# Patient Record
Sex: Male | Born: 1940 | Race: White | Hispanic: No | Marital: Married | Smoking: Never smoker
Health system: Southern US, Community
[De-identification: ages and names within clinical notes are randomized; demographics above are authoritative.]

## PROBLEM LIST (undated history)

## (undated) DIAGNOSIS — M1990 Unspecified osteoarthritis, unspecified site: Secondary | ICD-10-CM

## (undated) DIAGNOSIS — I2510 Atherosclerotic heart disease of native coronary artery without angina pectoris: Secondary | ICD-10-CM

## (undated) DIAGNOSIS — I340 Nonrheumatic mitral (valve) insufficiency: Secondary | ICD-10-CM

## (undated) DIAGNOSIS — I10 Essential (primary) hypertension: Secondary | ICD-10-CM

## (undated) DIAGNOSIS — Z8679 Personal history of other diseases of the circulatory system: Secondary | ICD-10-CM

## (undated) DIAGNOSIS — G20A1 Parkinson's disease without dyskinesia, without mention of fluctuations: Secondary | ICD-10-CM

## (undated) DIAGNOSIS — G459 Transient cerebral ischemic attack, unspecified: Secondary | ICD-10-CM

## (undated) DIAGNOSIS — N400 Enlarged prostate without lower urinary tract symptoms: Secondary | ICD-10-CM

## (undated) DIAGNOSIS — K219 Gastro-esophageal reflux disease without esophagitis: Secondary | ICD-10-CM

## (undated) DIAGNOSIS — C61 Malignant neoplasm of prostate: Secondary | ICD-10-CM

## (undated) DIAGNOSIS — Z8546 Personal history of malignant neoplasm of prostate: Secondary | ICD-10-CM

## (undated) HISTORY — DX: Parkinson's disease without dyskinesia, without mention of fluctuations: G20.A1

## (undated) HISTORY — PX: ACHILLES TENDON REPAIR: SUR1153

## (undated) HISTORY — DX: Personal history of other diseases of the circulatory system: Z86.79

## (undated) HISTORY — DX: Unspecified osteoarthritis, unspecified site: M19.90

## (undated) HISTORY — DX: Personal history of malignant neoplasm of prostate: Z85.46

## (undated) HISTORY — DX: Transient cerebral ischemic attack, unspecified: G45.9

## (undated) HISTORY — PX: HERNIA REPAIR: SHX51

## (undated) HISTORY — DX: Atherosclerotic heart disease of native coronary artery without angina pectoris: I25.10

## (undated) HISTORY — DX: Benign prostatic hyperplasia without lower urinary tract symptoms: N40.0

## (undated) HISTORY — DX: Nonrheumatic mitral (valve) insufficiency: I34.0

## (undated) HISTORY — PX: PROSTATE BIOPSY: SHX241

## (undated) HISTORY — PX: ARTHROSCOPY KNEE W/ DRILLING: SUR92

---
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
## 2011-08-27 ENCOUNTER — Other Ambulatory Visit: Payer: Self-pay | Admitting: Orthopaedic Surgery

## 2011-08-27 DIAGNOSIS — M545 Low back pain: Secondary | ICD-10-CM

## 2011-09-01 ENCOUNTER — Ambulatory Visit
Admission: RE | Admit: 2011-09-01 | Discharge: 2011-09-01 | Disposition: A | Discharge status: Home / Self Care | Payer: Medicare Other | Source: Ambulatory Visit | Attending: Orthopaedic Surgery | Admitting: Orthopaedic Surgery

## 2011-09-01 DIAGNOSIS — M545 Low back pain: Secondary | ICD-10-CM

## 2011-09-02 ENCOUNTER — Other Ambulatory Visit: Payer: Self-pay

## 2015-01-21 DIAGNOSIS — T485X5A Adverse effect of other anti-common-cold drugs, initial encounter: Secondary | ICD-10-CM | POA: Insufficient documentation

## 2015-01-21 DIAGNOSIS — J3489 Other specified disorders of nose and nasal sinuses: Secondary | ICD-10-CM

## 2015-01-21 DIAGNOSIS — J310 Chronic rhinitis: Secondary | ICD-10-CM | POA: Insufficient documentation

## 2015-01-21 DIAGNOSIS — J343 Hypertrophy of nasal turbinates: Secondary | ICD-10-CM | POA: Insufficient documentation

## 2015-01-21 HISTORY — DX: Other specified disorders of nose and nasal sinuses: J34.89

## 2015-05-08 DIAGNOSIS — L570 Actinic keratosis: Secondary | ICD-10-CM | POA: Diagnosis not present

## 2015-05-08 DIAGNOSIS — L820 Inflamed seborrheic keratosis: Secondary | ICD-10-CM | POA: Diagnosis not present

## 2015-05-08 DIAGNOSIS — L578 Other skin changes due to chronic exposure to nonionizing radiation: Secondary | ICD-10-CM | POA: Diagnosis not present

## 2015-05-08 DIAGNOSIS — L821 Other seborrheic keratosis: Secondary | ICD-10-CM | POA: Diagnosis not present

## 2015-06-10 DIAGNOSIS — J9811 Atelectasis: Secondary | ICD-10-CM | POA: Diagnosis not present

## 2015-06-10 DIAGNOSIS — R0789 Other chest pain: Secondary | ICD-10-CM | POA: Diagnosis not present

## 2015-06-10 DIAGNOSIS — S299XXA Unspecified injury of thorax, initial encounter: Secondary | ICD-10-CM | POA: Diagnosis not present

## 2015-06-10 DIAGNOSIS — Y9389 Activity, other specified: Secondary | ICD-10-CM | POA: Diagnosis not present

## 2015-06-10 DIAGNOSIS — S20211A Contusion of right front wall of thorax, initial encounter: Secondary | ICD-10-CM | POA: Diagnosis not present

## 2015-06-10 DIAGNOSIS — W1789XA Other fall from one level to another, initial encounter: Secondary | ICD-10-CM | POA: Diagnosis not present

## 2015-06-10 DIAGNOSIS — M25551 Pain in right hip: Secondary | ICD-10-CM | POA: Diagnosis not present

## 2015-06-10 DIAGNOSIS — R0781 Pleurodynia: Secondary | ICD-10-CM | POA: Diagnosis not present

## 2015-06-11 DIAGNOSIS — S299XXA Unspecified injury of thorax, initial encounter: Secondary | ICD-10-CM | POA: Diagnosis not present

## 2015-06-11 DIAGNOSIS — M25551 Pain in right hip: Secondary | ICD-10-CM | POA: Diagnosis not present

## 2015-07-17 DIAGNOSIS — D510 Vitamin B12 deficiency anemia due to intrinsic factor deficiency: Secondary | ICD-10-CM | POA: Diagnosis not present

## 2015-08-22 DIAGNOSIS — D510 Vitamin B12 deficiency anemia due to intrinsic factor deficiency: Secondary | ICD-10-CM | POA: Diagnosis not present

## 2015-08-26 DIAGNOSIS — M25512 Pain in left shoulder: Secondary | ICD-10-CM | POA: Diagnosis not present

## 2015-08-26 DIAGNOSIS — D510 Vitamin B12 deficiency anemia due to intrinsic factor deficiency: Secondary | ICD-10-CM | POA: Diagnosis not present

## 2015-08-26 DIAGNOSIS — Z79899 Other long term (current) drug therapy: Secondary | ICD-10-CM | POA: Diagnosis not present

## 2015-08-26 DIAGNOSIS — Z0000 Encounter for general adult medical examination without abnormal findings: Secondary | ICD-10-CM | POA: Diagnosis not present

## 2015-08-26 DIAGNOSIS — E785 Hyperlipidemia, unspecified: Secondary | ICD-10-CM | POA: Diagnosis not present

## 2015-08-26 DIAGNOSIS — Z1389 Encounter for screening for other disorder: Secondary | ICD-10-CM | POA: Diagnosis not present

## 2015-08-26 DIAGNOSIS — Z125 Encounter for screening for malignant neoplasm of prostate: Secondary | ICD-10-CM | POA: Diagnosis not present

## 2015-08-26 DIAGNOSIS — G479 Sleep disorder, unspecified: Secondary | ICD-10-CM | POA: Diagnosis not present

## 2015-08-26 DIAGNOSIS — R5383 Other fatigue: Secondary | ICD-10-CM | POA: Diagnosis not present

## 2015-09-19 DIAGNOSIS — E538 Deficiency of other specified B group vitamins: Secondary | ICD-10-CM | POA: Diagnosis not present

## 2015-11-06 DIAGNOSIS — E538 Deficiency of other specified B group vitamins: Secondary | ICD-10-CM | POA: Diagnosis not present

## 2015-11-07 DIAGNOSIS — N401 Enlarged prostate with lower urinary tract symptoms: Secondary | ICD-10-CM | POA: Diagnosis not present

## 2015-11-07 DIAGNOSIS — R3915 Urgency of urination: Secondary | ICD-10-CM | POA: Diagnosis not present

## 2015-11-07 DIAGNOSIS — R972 Elevated prostate specific antigen [PSA]: Secondary | ICD-10-CM | POA: Diagnosis not present

## 2015-11-07 DIAGNOSIS — R3911 Hesitancy of micturition: Secondary | ICD-10-CM | POA: Diagnosis not present

## 2015-11-07 DIAGNOSIS — R351 Nocturia: Secondary | ICD-10-CM | POA: Diagnosis not present

## 2015-11-14 DIAGNOSIS — L578 Other skin changes due to chronic exposure to nonionizing radiation: Secondary | ICD-10-CM | POA: Diagnosis not present

## 2015-11-14 DIAGNOSIS — L570 Actinic keratosis: Secondary | ICD-10-CM | POA: Diagnosis not present

## 2015-12-02 DIAGNOSIS — E538 Deficiency of other specified B group vitamins: Secondary | ICD-10-CM | POA: Diagnosis not present

## 2016-01-26 DIAGNOSIS — E538 Deficiency of other specified B group vitamins: Secondary | ICD-10-CM | POA: Diagnosis not present

## 2016-03-03 DIAGNOSIS — E538 Deficiency of other specified B group vitamins: Secondary | ICD-10-CM | POA: Diagnosis not present

## 2016-03-22 ENCOUNTER — Ambulatory Visit (INDEPENDENT_AMBULATORY_CARE_PROVIDER_SITE_OTHER): Payer: PPO

## 2016-03-22 ENCOUNTER — Encounter (INDEPENDENT_AMBULATORY_CARE_PROVIDER_SITE_OTHER): Payer: Self-pay | Admitting: Orthopaedic Surgery

## 2016-03-22 ENCOUNTER — Ambulatory Visit (INDEPENDENT_AMBULATORY_CARE_PROVIDER_SITE_OTHER): Payer: PPO | Admitting: Orthopaedic Surgery

## 2016-03-22 VITALS — BP 130/78 | HR 68 | Ht 69.0 in | Wt 190.0 lb

## 2016-03-22 DIAGNOSIS — M25512 Pain in left shoulder: Secondary | ICD-10-CM | POA: Diagnosis not present

## 2016-03-22 DIAGNOSIS — M545 Low back pain: Secondary | ICD-10-CM

## 2016-03-22 DIAGNOSIS — G8929 Other chronic pain: Secondary | ICD-10-CM

## 2016-03-22 DIAGNOSIS — G894 Chronic pain syndrome: Secondary | ICD-10-CM

## 2016-03-22 MED ORDER — METHYLPREDNISOLONE ACETATE 40 MG/ML IJ SUSP
80.0000 mg | INTRAMUSCULAR | Status: AC | PRN
  Administered 2016-03-22: 80 mg

## 2016-03-22 MED ORDER — LIDOCAINE HCL 1 % IJ SOLN
2.0000 mL | INTRAMUSCULAR | Status: AC | PRN
  Administered 2016-03-22: 2 mL

## 2016-03-22 MED ORDER — BUPIVACAINE HCL 0.5 % IJ SOLN
2.0000 mL | INTRAMUSCULAR | Status: AC | PRN
  Administered 2016-03-22: 2 mL via INTRA_ARTICULAR

## 2016-03-22 NOTE — Progress Notes (Signed)
Office Visit Note   Patient: Wesley Jackson           Date of Birth: Oct 12, 1940           MRN: [id number] Visit Date: 03/22/2016              Requested by: No referring provider defined for this encounter. PCP: Angelina Sheriff., MD   Assessment & Plan: Visit Diagnoses: No diagnosis found.  Plan: Follow up in 3-4 weeks for left shoulder pain if no response to cortisone. We will see after MRI scan of lumbar spine.  Follow-Up Instructions: No Follow-up on file.   Orders:  No orders of the defined types were placed in this encounter.  No orders of the defined types were placed in this encounter.     Procedures: Large Joint Inj Date/Time: 03/22/2016 11:19 AM Performed by: Garald Balding Authorized by: Garald Balding   Consent Given by:  Patient Timeout: prior to procedure the correct patient, procedure, and site was verified   Indications:  Pain Location:  Shoulder Site:  L subacromial bursa Prep: patient was prepped and draped in usual sterile fashion   Needle Size:  25 G Needle Length:  1.5 inches Approach:  Lateral Ultrasound Guidance: No   Fluoroscopic Guidance: No   Arthrogram: No   Medications:  80 mg methylPREDNISolone acetate 40 MG/ML; 2 mL lidocaine 1 %; 2 mL bupivacaine 0.5 % Aspiration Attempted: No   Patient tolerance:  Patient tolerated the procedure well with no immediate complications     Clinical Data: No additional findings.   Subjective: No chief complaint on file.   Pt fell 06/10/2015, repairing a wall, fell about 5 feet down to ground. Had some pain in left should and ribs  Pt has had cortisone injections in the past and would like one today   Mr. Dower is been experiencing left shoulder pain since his fall in February 2017. Pain seems to have exacerbated to the point where he is having trouble sleeping. The pain is located along the anterior lateral subacromial region. There is no ecchymosis or erythema at the time of the  injury.  He also is been experiencing some right sided low back pain since the injury. He's had some difficulty when he swings a golf club. Denies any trouble with cough or sneeze. He's not had any pain referred to either lower extremity. He does have a prior history of low back pain with the injections. Review of Systems   Objective: Vital Signs: There were no vitals taken for this visit.  Physical Exam  Ortho Exam left shoulder exam reveals positive impingement and positive empty can testing. He does have a painful overhead arc of motion but is able to place his arm fully over his head and is able to touch the middle of his back. Biceps was intact. There was no crepitation. He had some mild discomfort at the before meals joint.  Straight leg raise is negative bilaterally pain is range of motion of both hips. There was no flank discomfort. It was some pain in the right paralumbar region without mass formation. No pain over any of the lower right ribs  Specialty Comments:  No specialty comments available.  Imaging: No results found.   PMFS History: There are no active problems to display for this patient.  No past medical history on file.  No family history on file.  No past surgical history on file. Social History   Occupational History  .  Not on file.   Social History Main Topics  . Smoking status: Not on file  . Smokeless tobacco: Not on file  . Alcohol use Not on file  . Drug use: Unknown  . Sexual activity: Not on file

## 2016-03-31 ENCOUNTER — Telehealth (INDEPENDENT_AMBULATORY_CARE_PROVIDER_SITE_OTHER): Payer: Self-pay | Admitting: Orthopaedic Surgery

## 2016-03-31 NOTE — Telephone Encounter (Signed)
Patient's wife called and says they have not heard anything about scheduling an MRI. Has this been ordered? Please call patient.

## 2016-04-01 NOTE — Telephone Encounter (Signed)
Spoke with wife that a referral was made and sent for scheduling with imaging

## 2016-04-05 ENCOUNTER — Ambulatory Visit (INDEPENDENT_AMBULATORY_CARE_PROVIDER_SITE_OTHER): Payer: PPO | Admitting: Orthopaedic Surgery

## 2016-04-08 ENCOUNTER — Ambulatory Visit
Admission: RE | Admit: 2016-04-08 | Discharge: 2016-04-08 | Disposition: A | Discharge status: Home / Self Care | Payer: PPO | Source: Ambulatory Visit | Attending: Orthopaedic Surgery | Admitting: Orthopaedic Surgery

## 2016-04-08 DIAGNOSIS — M5126 Other intervertebral disc displacement, lumbar region: Secondary | ICD-10-CM | POA: Diagnosis not present

## 2016-04-08 DIAGNOSIS — G8929 Other chronic pain: Secondary | ICD-10-CM

## 2016-04-08 DIAGNOSIS — M545 Low back pain, unspecified: Secondary | ICD-10-CM

## 2016-04-12 ENCOUNTER — Other Ambulatory Visit (INDEPENDENT_AMBULATORY_CARE_PROVIDER_SITE_OTHER): Payer: Self-pay

## 2016-04-12 ENCOUNTER — Encounter (INDEPENDENT_AMBULATORY_CARE_PROVIDER_SITE_OTHER): Payer: Self-pay | Admitting: Orthopaedic Surgery

## 2016-04-12 ENCOUNTER — Ambulatory Visit (INDEPENDENT_AMBULATORY_CARE_PROVIDER_SITE_OTHER): Payer: PPO

## 2016-04-12 ENCOUNTER — Ambulatory Visit (INDEPENDENT_AMBULATORY_CARE_PROVIDER_SITE_OTHER): Payer: PPO | Admitting: Orthopaedic Surgery

## 2016-04-12 VITALS — Ht 69.0 in | Wt 190.0 lb

## 2016-04-12 DIAGNOSIS — R0781 Pleurodynia: Secondary | ICD-10-CM | POA: Diagnosis not present

## 2016-04-12 NOTE — Progress Notes (Signed)
   Office Visit Note   Patient: Wesley Jackson           Date of Birth: [birth year]           MRN: [id number] Visit Date: 04/12/2016              Requested by: Angelina Sheriff, MD [place],  [id number] PCP: Angelina Sheriff., MD   Assessment & Plan: Visit Diagnoses: low back and rib pain  Plan: I think the problem is related to his rib cage. I'm not so sure that the pain is referred from his lumbar spine. Based on his regular blood films I think he's had a costochondritis involving the eighth ninth and 10th ribs Mr. Troup is still a little concerned about his pain I think just to allay his fears were need to obtain an MRI scan of that area. Pain does come and go it is not constant and it really hasn't changed since his accident.  Follow-Up Instructions: No Follow-up on file.   Orders:  No orders of the defined types were placed in this encounter.  No orders of the defined types were placed in this encounter.     Procedures: No procedures performed   Clinical Data: No additional findings.   Subjective: No chief complaint on file.   Pt still having middle to lower back pain.  Mr. Board had an MRI scan of his lumbar spine that was essentially unchanged from the scan that was performed in 2013. 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 He had an injury as mentioned in February 2017 and has had some trouble with the right side of his back since that time. He actually had some trouble prior to that injury he apparently had an extensive evaluation after that fall but continues to have some pain in the right paralumbar region that actually may correspond to to a repair. He isnot having any bowel or bladder dysfunction and has not had any abdominal complaints. His had some lab work performed through his primary care physician at [facility] in [place]. He notes  that it was "okay.  Review of Systems   Objective: Vital Signs: There were no vitals taken for this visit.  Physical Exam  Ortho Exam straight leg raise is negative bilaterally reflexes appear to be symmetrical. Mr. Latimer does have some pain that corresponds to one of the lower ribs and the right paralumbar region not having any shortness of breath or chest pain.   No specialty comments available.  Imaging: No results found.   PMFS History: There are no active problems to display for this patient.  No past medical history on file.  No family history on file.  No past surgical history on file. Social History   Occupational History  . Not on file.   Social History Main Topics  . Smoking status: Never Smoker  . Smokeless tobacco: Never Used  . Alcohol use 0.6 oz/week    1 Cans of beer per week  . Drug use: No  . Sexual activity: Not on file

## 2016-04-13 NOTE — Addendum Note (Signed)
Addended by: Shona Needles on: 04/13/2016 11:32 AM   Modules accepted: Orders

## 2016-04-30 ENCOUNTER — Other Ambulatory Visit: Payer: PPO

## 2016-05-03 ENCOUNTER — Ambulatory Visit (INDEPENDENT_AMBULATORY_CARE_PROVIDER_SITE_OTHER): Payer: PPO | Admitting: Orthopaedic Surgery

## 2016-05-14 ENCOUNTER — Telehealth (INDEPENDENT_AMBULATORY_CARE_PROVIDER_SITE_OTHER): Payer: Self-pay | Admitting: Orthopaedic Surgery

## 2016-05-14 NOTE — Telephone Encounter (Signed)
Patient called on status of MRI for upper back and rib cage. Please call patient back at [phone]

## 2016-05-14 NOTE — Telephone Encounter (Signed)
Please call.

## 2016-05-18 NOTE — Telephone Encounter (Signed)
Please call with results

## 2016-05-18 NOTE — Telephone Encounter (Signed)
Please have someone check on why rthis has not been done yet

## 2016-05-19 NOTE — Telephone Encounter (Signed)
Thank you :)

## 2016-05-19 NOTE — Telephone Encounter (Signed)
Patient called on status of MRI, I explained [facility] had tried calling. Patient is going to call them and schedule MRI; will call us once scheduled.

## 2016-05-19 NOTE — Telephone Encounter (Signed)
[facility] contacted pt on 04/14/16 & 04/22/16 and both times left message on machine to return call, they are waiting on pt to contact them. Everything on our end has been taken care of.

## 2016-05-19 NOTE — Telephone Encounter (Signed)
Could you please check and see why this isn't done? Thank you!

## 2016-05-26 ENCOUNTER — Telehealth (INDEPENDENT_AMBULATORY_CARE_PROVIDER_SITE_OTHER): Payer: Self-pay | Admitting: Orthopaedic Surgery

## 2016-05-26 NOTE — Telephone Encounter (Signed)
Patient's wife called about status of MRI. Please advise.

## 2016-05-27 NOTE — Telephone Encounter (Signed)
I called [facility], patient has been left messages x 2 to call to schedule MRI appt, I called patient and advised him to call [facility] to schedule MRI appt.

## 2016-05-27 NOTE — Telephone Encounter (Signed)
Please advise 

## 2016-05-27 NOTE — Telephone Encounter (Signed)
Please resolve this re the MRI-going on for nearly 6 weeks

## 2016-06-04 ENCOUNTER — Ambulatory Visit
Admission: RE | Admit: 2016-06-04 | Discharge: 2016-06-04 | Disposition: A | Discharge status: Home / Self Care | Payer: PPO | Source: Ambulatory Visit | Attending: Orthopaedic Surgery | Admitting: Orthopaedic Surgery

## 2016-06-04 DIAGNOSIS — R0781 Pleurodynia: Secondary | ICD-10-CM

## 2016-06-04 DIAGNOSIS — M62838 Other muscle spasm: Secondary | ICD-10-CM | POA: Diagnosis not present

## 2016-06-14 ENCOUNTER — Ambulatory Visit (INDEPENDENT_AMBULATORY_CARE_PROVIDER_SITE_OTHER): Payer: PPO | Admitting: Orthopaedic Surgery

## 2016-06-23 DIAGNOSIS — H524 Presbyopia: Secondary | ICD-10-CM | POA: Diagnosis not present

## 2016-06-23 DIAGNOSIS — H2513 Age-related nuclear cataract, bilateral: Secondary | ICD-10-CM | POA: Diagnosis not present

## 2016-06-28 DIAGNOSIS — H52202 Unspecified astigmatism, left eye: Secondary | ICD-10-CM | POA: Diagnosis not present

## 2016-06-28 DIAGNOSIS — Z01818 Encounter for other preprocedural examination: Secondary | ICD-10-CM | POA: Diagnosis not present

## 2016-06-28 DIAGNOSIS — H2512 Age-related nuclear cataract, left eye: Secondary | ICD-10-CM | POA: Diagnosis not present

## 2016-06-28 DIAGNOSIS — H25812 Combined forms of age-related cataract, left eye: Secondary | ICD-10-CM | POA: Diagnosis not present

## 2016-06-28 DIAGNOSIS — H2513 Age-related nuclear cataract, bilateral: Secondary | ICD-10-CM | POA: Diagnosis not present

## 2016-07-14 DIAGNOSIS — H2511 Age-related nuclear cataract, right eye: Secondary | ICD-10-CM | POA: Diagnosis not present

## 2016-07-14 DIAGNOSIS — H52201 Unspecified astigmatism, right eye: Secondary | ICD-10-CM | POA: Diagnosis not present

## 2016-07-14 DIAGNOSIS — H2513 Age-related nuclear cataract, bilateral: Secondary | ICD-10-CM | POA: Diagnosis not present

## 2016-07-14 DIAGNOSIS — H25811 Combined forms of age-related cataract, right eye: Secondary | ICD-10-CM | POA: Diagnosis not present

## 2016-07-20 DIAGNOSIS — E538 Deficiency of other specified B group vitamins: Secondary | ICD-10-CM | POA: Diagnosis not present

## 2016-08-05 DIAGNOSIS — Z6829 Body mass index (BMI) 29.0-29.9, adult: Secondary | ICD-10-CM | POA: Diagnosis not present

## 2016-08-05 DIAGNOSIS — J329 Chronic sinusitis, unspecified: Secondary | ICD-10-CM | POA: Diagnosis not present

## 2016-08-05 DIAGNOSIS — J40 Bronchitis, not specified as acute or chronic: Secondary | ICD-10-CM | POA: Diagnosis not present

## 2016-08-19 DIAGNOSIS — E538 Deficiency of other specified B group vitamins: Secondary | ICD-10-CM | POA: Diagnosis not present

## 2016-08-19 DIAGNOSIS — Z6829 Body mass index (BMI) 29.0-29.9, adult: Secondary | ICD-10-CM | POA: Diagnosis not present

## 2016-08-19 DIAGNOSIS — M545 Low back pain: Secondary | ICD-10-CM | POA: Diagnosis not present

## 2016-08-23 ENCOUNTER — Telehealth (INDEPENDENT_AMBULATORY_CARE_PROVIDER_SITE_OTHER): Payer: Self-pay | Admitting: Physical Medicine and Rehabilitation

## 2016-08-23 NOTE — Telephone Encounter (Signed)
Ok to [employer]	[place]

## 2016-08-24 NOTE — Telephone Encounter (Signed)
Patient is scheduled for an OV 08/26/16 at [facility].

## 2016-08-26 ENCOUNTER — Encounter (INDEPENDENT_AMBULATORY_CARE_PROVIDER_SITE_OTHER): Payer: Self-pay

## 2016-08-26 ENCOUNTER — Telehealth (INDEPENDENT_AMBULATORY_CARE_PROVIDER_SITE_OTHER): Payer: Self-pay | Admitting: Physical Medicine and Rehabilitation

## 2016-08-26 ENCOUNTER — Encounter (INDEPENDENT_AMBULATORY_CARE_PROVIDER_SITE_OTHER): Payer: Self-pay | Admitting: Physical Medicine and Rehabilitation

## 2016-08-26 ENCOUNTER — Ambulatory Visit (INDEPENDENT_AMBULATORY_CARE_PROVIDER_SITE_OTHER): Payer: PPO | Admitting: Physical Medicine and Rehabilitation

## 2016-08-26 VITALS — BP 177/87 | HR 59

## 2016-08-26 DIAGNOSIS — G8929 Other chronic pain: Secondary | ICD-10-CM

## 2016-08-26 DIAGNOSIS — M47816 Spondylosis without myelopathy or radiculopathy, lumbar region: Secondary | ICD-10-CM

## 2016-08-26 DIAGNOSIS — M545 Low back pain: Secondary | ICD-10-CM

## 2016-08-26 NOTE — Progress Notes (Deleted)
Lower back pain- more on the right, but some pain on the left. Pain does not radiate. No numbness or tingling. Pain increases with turning and playing golf. No pain when sitting. Pain does increase with activity, and he feels like he is having muscle spasms.

## 2016-08-26 NOTE — Progress Notes (Signed)
Wesley Jackson - 76 y.o. male MRN [id number]  Date of birth: Aug 11, 1940  Office Visit Note: Visit Date: 08/26/2016 PCP: Angelina Sheriff., MD Referred by: Angelina Sheriff, MD  Subjective: Chief Complaint  Patient presents with  . Lower Back - Pain   HPI: Wesley Jackson is a very pleasant and active and young appearing 76 year old gentleman normally followed by Dr. Durward Fortes for his orthopedic complaints. 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 We completed right L3 transforaminal injection with really good relief he was very happy at the time. Sometime last year he began having some right flank pain without radicular component. An MRI of the lumbar spine was updated at the time but it was felt like maybe his pain was occurring higher up an MRI of the chest or thoracic spine was performed which was really unrevealing. He has since gone on to have really no pain in that upper part of the flank again. His biggest complaint today is chronic worsening several month history of right lower back pain. Is worse when he extends and rotates her crutches to the right. He says it was one episode of playing golf or is in a sand trap any kind of lean the wrong way doing the shot and it did seem to really cause thinks to flareup and he actually had to quit playing that day. He was actually having pain before that time today was just sort of playing through things. He does stay active with exercise and he has not been playing golf now because of the pain. He has been taking some anti-inflammatory medications without much relief. He was scheduled to have physical therapy recently but had to cancel but does want to restart that. He does feel like at times he has more of a muscle spasm but really it's a chronic low-grade pain most of the time. It is worse with activity. No numbness tingling or paresthesias or focal weakness. No  bowel or bladder dysfunction. No unintended weight loss. No prior lumbar surgery. MRI of the lumbar spine from last year is reviewed below.    Review of Systems  Constitutional: Negative for chills, fever, malaise/fatigue and weight loss.  HENT: Negative for hearing loss and sinus pain.   Eyes: Negative for blurred vision, double vision and photophobia.  Respiratory: Negative for cough and shortness of breath.   Cardiovascular: Negative for chest pain, palpitations and leg swelling.  Gastrointestinal: Negative for abdominal pain, nausea and vomiting.  Genitourinary: Negative for flank pain.  Musculoskeletal: Positive for back pain. Negative for myalgias.  Skin: Negative for itching and rash.  Neurological: Negative for tremors, focal weakness and weakness.  Endo/Heme/Allergies: Negative.   Psychiatric/Behavioral: Negative for depression.  All other systems reviewed and are negative.  Otherwise per HPI.  Assessment & Plan: Visit Diagnoses:  1. Chronic right-sided low back pain without sciatica   2. Spondylosis without myelopathy or radiculopathy, lumbar region     Plan: Findings:  Chronic history of back pain and lumbar spine issues with significant disc herniation a few years ago which has since resolved and is having no radicular complaints. More recent mechanical type pain worse with getting up and down and twisting. MRI which is reviewed shows facet hypertrophy particularly at L4-5 and I feel like most of his pain is facet mediated. I think some of his pain is probably myofascial. At this point  I told him to hold off with physical therapy and I want to complete a diagnostic right L4-5 facet joint block. If he does well with that we could focus his physical therapy. We talked about activity modification at great length today I talked about warming up for playing golf and that he was probably fine to swing the club some distant get loosened up the right now going to hold off for many hard  swings or drives. She'll talked about core strengthening with him as well. His disc herniation from prior has resolved to a good degree. There is some foraminal narrowing at L4-5 just from a bony standpoint and that could be part of this. I want to regroup with physical therapy depending on the results of the injection. I would not change any other medications at this point. He might benefit from intermittent course of stronger anti-inflammatory. He may do well with scheduled Tylenol. He may do well with a muscle relaxer. I spent more than 25 minutes speaking face-to-face with the patient with 50% of the time in counseling.    Meds & Orders: No orders of the defined types were placed in this encounter.  No orders of the defined types were placed in this encounter.   Follow-up: Return for Right L4-5 facet joint block. We might want to look at L3-4 as well..   Procedures: No procedures performed  No notes on file   Clinical History: Lumbar spine MRI 04/08/2016  L3-4: Right foraminal disc protrusion on the prior study has regressed in the interim with improved right foraminal patency. Circumferential disc bulging and mild facet hypertrophy result in mild right greater than left neural foraminal stenosis without evidence of L3 nerve impingement. There is borderline bilateral lateral recess stenosis. No significant overall spinal stenosis.   L4-5: Circumferential disc bulging, small right foraminal disc protrusion, and facet hypertrophy result in moderate right and mild left neural foraminal stenosis and borderline bilateral lateral recess stenosis, unchanged.   L5-S1: Left foraminal disc osteophyte complex and mild facet arthrosis result in mild left neural foraminal stenosis, unchanged. No spinal stenosis.   IMPRESSION: 1. Regression of L3-4 disc protrusion with improved right neural foraminal patency. 2. Unchanged disc and facet degeneration elsewhere including at L4-5 where there is  moderate right neural foraminal stenosis.  He reports that he has never smoked. He has never used smokeless tobacco. No results for input(s): HGBA1C, LABURIC in the last 8760 hours.  Objective:  VS:  HT:    WT:   BMI:     BP:(!) 177/87  HR:(!) 59bpm  TEMP: ( )  RESP:  Physical Exam  Constitutional: He is oriented to person, place, and time. He appears well-developed and well-nourished. No distress.  HENT:  Head: Normocephalic and atraumatic.  Eyes: Conjunctivae are normal. Pupils are equal, round, and reactive to light.  Neck: Normal range of motion. Neck supple.  Cardiovascular: Regular rhythm and intact distal pulses.   Pulmonary/Chest: Effort normal. No respiratory distress.  Musculoskeletal:  Patient ambulates without aid with a fairly normal gait. He is having concordant low back pain with extension rotation to the right more than left. He has no hip pain with rotation internal or external. He has no pain over the greater trochanters is good distal strength without clonus. He does have tenderness and likely focal trigger point in the quadratus lumborum on the right.  Neurological: He is alert and oriented to person, place, and time. He exhibits normal muscle tone. Coordination normal.  Skin:  Skin is warm and dry. No rash noted. No erythema.  Psychiatric: He has a normal mood and affect.  Nursing note and vitals reviewed.   Ortho Exam Imaging: No results found.  Past Medical/Family/Surgical/Social History: Medications & Allergies reviewed per EMR There are no active problems to display for this patient.  History reviewed. No pertinent past medical history. History reviewed. No pertinent family history. History reviewed. No pertinent surgical history. Social History   Occupational History  . Not on file.   Social History Main Topics  . Smoking status: Never Smoker  . Smokeless tobacco: Never Used  . Alcohol use 0.6 oz/week    1 Cans of beer per week  . Drug use: No    . Sexual activity: Not on file

## 2016-08-27 NOTE — Telephone Encounter (Signed)
Submitted auth request on [employer] website 

## 2016-09-09 NOTE — Telephone Encounter (Signed)
Still pending per [employer] website

## 2016-09-09 NOTE — Telephone Encounter (Signed)
Received call from [employer] stating this is being sent to medical director for review because they are not able to approve since pt does not meet all criteria such as the PT requireent. May get a call in the morning from 8:30 to 10:30. Gave them my direct phone # to call.

## 2016-09-10 ENCOUNTER — Telehealth (INDEPENDENT_AMBULATORY_CARE_PROVIDER_SITE_OTHER): Payer: Self-pay | Admitting: Physical Medicine and Rehabilitation

## 2016-09-10 NOTE — Telephone Encounter (Signed)
Received authorization from [facility] for [phone] and [phone]. Auth #[id number]. Patient is scheduled for 09/22/16 at 1430.

## 2016-09-22 ENCOUNTER — Encounter (INDEPENDENT_AMBULATORY_CARE_PROVIDER_SITE_OTHER): Payer: Self-pay | Admitting: Physical Medicine and Rehabilitation

## 2016-09-22 ENCOUNTER — Ambulatory Visit (INDEPENDENT_AMBULATORY_CARE_PROVIDER_SITE_OTHER): Payer: Self-pay

## 2016-09-22 ENCOUNTER — Ambulatory Visit (INDEPENDENT_AMBULATORY_CARE_PROVIDER_SITE_OTHER): Payer: PPO | Admitting: Physical Medicine and Rehabilitation

## 2016-09-22 VITALS — BP 152/74 | HR 70

## 2016-09-22 DIAGNOSIS — G8929 Other chronic pain: Secondary | ICD-10-CM | POA: Diagnosis not present

## 2016-09-22 DIAGNOSIS — M545 Low back pain, unspecified: Secondary | ICD-10-CM

## 2016-09-22 DIAGNOSIS — M47816 Spondylosis without myelopathy or radiculopathy, lumbar region: Secondary | ICD-10-CM | POA: Diagnosis not present

## 2016-09-22 DIAGNOSIS — M609 Myositis, unspecified: Secondary | ICD-10-CM

## 2016-09-22 MED ORDER — METHYLPREDNISOLONE ACETATE 80 MG/ML IJ SUSP
80.0000 mg | Freq: Once | INTRAMUSCULAR | Status: AC
  Administered 2016-09-22: 80 mg

## 2016-09-22 MED ORDER — LIDOCAINE HCL (PF) 1 % IJ SOLN
2.0000 mL | Freq: Once | INTRAMUSCULAR | Status: AC
  Administered 2016-09-22: 2 mL

## 2016-09-22 NOTE — Patient Instructions (Signed)

## 2016-09-22 NOTE — Progress Notes (Deleted)
Patient is here today for planned right L3-4 and L4-5 facet.. Patient states that now pain is worse on the left side. No leg pain.

## 2016-09-23 NOTE — Procedures (Signed)
Lumbar Facet Joint Intra-Articular Injection(s) with Fluoroscopic Guidance  Patient: Wesley Jackson      Date of Birth: 11/25/1940 MRN: [id number] PCP: Angelina Sheriff, MD      Visit Date: 09/22/2016   Wesley Jackson is a 76 year old gentleman with chronic mostly right-sided back pain history but now with left-sided complaints. He is an active individual who still likes to play golf fully as a plate recently because of his back pain. We saw him at the request of Dr. Durward Fortes. He had 2 MRIs completed for what was more chest and flank pain with no real solution twice was having that pain but that pain resolved. When I saw him last he was having axial right-sided pain worse with going from sit to stand with twisting. He did not have any focal trigger points on exam I'll have some paraspinal tightness and tightness in the quadratus lumborum. We elected to complete diagnostic facet joint blocks and had health team advantage try to preauthorize those. He was on the verge of having that denied because they were considering that he needed physical therapy first. They did approve the injection easier for this today. Since its taken 3-4 weeks to get the approval his pain is really switch for the left side. It's still axial still worse with twisting and extension. I think he is getting facet pain and probably myofascial pain. Within a complete diagnostic left-sided L3-4 and L4-5 facet joint blocks. Were also go to have him regroup with physical therapy. He started but did not go to the first session because of increased pain. He would tolerate the therapy now we'll get that started for him. We'll see how this shot doesn't of the next few days diagnostically.  Universal Protocol:    Date/Time: 05/31/181:53 PM  Consent Given By: the patient  Position: PRONE   Additional Comments: Vital signs were monitored before and after the procedure. Patient was prepped and draped in the usual sterile fashion. The correct  patient, procedure, and site was verified.   Injection Procedure Details:  Procedure Site One Meds Administered:  Meds ordered this encounter  Medications  . lidocaine (PF) (XYLOCAINE) 1 % injection 2 mL  . methylPREDNISolone acetate (DEPO-MEDROL) injection 80 mg     Laterality: Left  Location/Site:  L3-L4 L4-L5  Needle size: 22 guage  Needle type: Spinal  Needle Placement: Articular  Findings:  -Contrast Used: 1 mL iohexol 180 mg iodine/mL   -Comments: Excellent flow of contrast producing a partial arthrogram.  Procedure Details: The fluoroscope beam is vertically oriented in AP, and the inferior recess is visualized beneath the lower pole of the inferior apophyseal process, which represents the target point for needle insertion. When direct visualization is difficult the target point is located at the medial projection of the vertebral pedicle. The region overlying each aforementioned target is locally anesthetized with a 1 to 2 ml. volume of 1% Lidocaine without Epinephrine.   The spinal needle was inserted into each of the above mentioned facet joints using biplanar fluoroscopic guidance. A 0.25 to 0.5 ml. volume of Isovue-250 was injected and a partial facet joint arthrogram was obtained. A single spot film was obtained of the resulting arthrogram.    One to 1.25 ml of the steroid/anesthetic solution was then injected into each of the facet joints noted above.   Additional Comments:  The patient tolerated the procedure well Dressing: Band-Aid    Post-procedure details: Patient was observed during the procedure. Post-procedure instructions were reviewed.  Patient left the clinic in stable condition.

## 2016-10-05 DIAGNOSIS — D510 Vitamin B12 deficiency anemia due to intrinsic factor deficiency: Secondary | ICD-10-CM | POA: Diagnosis not present

## 2016-11-08 DIAGNOSIS — E538 Deficiency of other specified B group vitamins: Secondary | ICD-10-CM | POA: Diagnosis not present

## 2016-11-18 DIAGNOSIS — L570 Actinic keratosis: Secondary | ICD-10-CM | POA: Diagnosis not present

## 2016-11-18 DIAGNOSIS — C44519 Basal cell carcinoma of skin of other part of trunk: Secondary | ICD-10-CM | POA: Diagnosis not present

## 2016-11-18 DIAGNOSIS — L821 Other seborrheic keratosis: Secondary | ICD-10-CM | POA: Diagnosis not present

## 2016-11-18 DIAGNOSIS — L578 Other skin changes due to chronic exposure to nonionizing radiation: Secondary | ICD-10-CM | POA: Diagnosis not present

## 2017-01-17 DIAGNOSIS — D510 Vitamin B12 deficiency anemia due to intrinsic factor deficiency: Secondary | ICD-10-CM | POA: Diagnosis not present

## 2017-01-31 DIAGNOSIS — Z6828 Body mass index (BMI) 28.0-28.9, adult: Secondary | ICD-10-CM | POA: Diagnosis not present

## 2017-01-31 DIAGNOSIS — J00 Acute nasopharyngitis [common cold]: Secondary | ICD-10-CM | POA: Diagnosis not present

## 2017-04-25 DIAGNOSIS — J209 Acute bronchitis, unspecified: Secondary | ICD-10-CM | POA: Diagnosis not present

## 2017-04-26 HISTORY — PX: EYE SURGERY: SHX253

## 2017-08-29 DIAGNOSIS — D510 Vitamin B12 deficiency anemia due to intrinsic factor deficiency: Secondary | ICD-10-CM | POA: Diagnosis not present

## 2017-10-21 DIAGNOSIS — L570 Actinic keratosis: Secondary | ICD-10-CM | POA: Diagnosis not present

## 2017-10-21 DIAGNOSIS — L578 Other skin changes due to chronic exposure to nonionizing radiation: Secondary | ICD-10-CM | POA: Diagnosis not present

## 2017-10-21 DIAGNOSIS — L821 Other seborrheic keratosis: Secondary | ICD-10-CM | POA: Diagnosis not present

## 2017-12-27 DIAGNOSIS — Z6827 Body mass index (BMI) 27.0-27.9, adult: Secondary | ICD-10-CM | POA: Diagnosis not present

## 2017-12-27 DIAGNOSIS — J329 Chronic sinusitis, unspecified: Secondary | ICD-10-CM | POA: Diagnosis not present

## 2017-12-27 DIAGNOSIS — Z0000 Encounter for general adult medical examination without abnormal findings: Secondary | ICD-10-CM | POA: Diagnosis not present

## 2017-12-27 DIAGNOSIS — J40 Bronchitis, not specified as acute or chronic: Secondary | ICD-10-CM | POA: Diagnosis not present

## 2018-02-08 DIAGNOSIS — D510 Vitamin B12 deficiency anemia due to intrinsic factor deficiency: Secondary | ICD-10-CM | POA: Diagnosis not present

## 2018-02-24 DIAGNOSIS — L570 Actinic keratosis: Secondary | ICD-10-CM | POA: Diagnosis not present

## 2018-02-24 DIAGNOSIS — L820 Inflamed seborrheic keratosis: Secondary | ICD-10-CM | POA: Diagnosis not present

## 2018-02-24 DIAGNOSIS — L578 Other skin changes due to chronic exposure to nonionizing radiation: Secondary | ICD-10-CM | POA: Diagnosis not present

## 2018-04-11 ENCOUNTER — Ambulatory Visit (INDEPENDENT_AMBULATORY_CARE_PROVIDER_SITE_OTHER): Payer: Self-pay

## 2018-04-11 ENCOUNTER — Ambulatory Visit (INDEPENDENT_AMBULATORY_CARE_PROVIDER_SITE_OTHER): Payer: PPO

## 2018-04-11 ENCOUNTER — Telehealth (INDEPENDENT_AMBULATORY_CARE_PROVIDER_SITE_OTHER): Payer: Self-pay | Admitting: Physical Medicine and Rehabilitation

## 2018-04-11 ENCOUNTER — Ambulatory Visit (INDEPENDENT_AMBULATORY_CARE_PROVIDER_SITE_OTHER): Payer: PPO | Admitting: Physical Medicine and Rehabilitation

## 2018-04-11 ENCOUNTER — Encounter (INDEPENDENT_AMBULATORY_CARE_PROVIDER_SITE_OTHER): Payer: Self-pay | Admitting: Physical Medicine and Rehabilitation

## 2018-04-11 VITALS — BP 157/83 | HR 69 | Ht 69.0 in | Wt 188.0 lb

## 2018-04-11 DIAGNOSIS — M545 Low back pain, unspecified: Secondary | ICD-10-CM

## 2018-04-11 DIAGNOSIS — M47816 Spondylosis without myelopathy or radiculopathy, lumbar region: Secondary | ICD-10-CM

## 2018-04-11 DIAGNOSIS — M25551 Pain in right hip: Secondary | ICD-10-CM

## 2018-04-11 DIAGNOSIS — G8929 Other chronic pain: Secondary | ICD-10-CM

## 2018-04-11 NOTE — Progress Notes (Signed)
 .  Numeric Pain Rating Scale and Functional Assessment Average Pain 4 Pain Right Now 2 My pain is intermittent and aching Pain is worse with: some activites Pain improves with: rest   In the last MONTH (on 0-10 scale) has pain interfered with the following?  1. General activity like being  able to carry out your everyday physical activities such as walking, climbing stairs, carrying groceries, or moving a chair?  Rating(3)  2. Relation with others like being able to carry out your usual social activities and roles such as  activities at home, at work and in your community. Rating(4)  3. Enjoyment of life such that you have  been bothered by emotional problems such as feeling anxious, depressed or irritable?  Rating(0)

## 2018-04-12 NOTE — Telephone Encounter (Signed)
Spoke with Wesley Jackson and she states no PA is needed for [id number]. Reference# [id number]

## 2018-05-01 ENCOUNTER — Ambulatory Visit (INDEPENDENT_AMBULATORY_CARE_PROVIDER_SITE_OTHER): Payer: PPO | Admitting: Physical Medicine and Rehabilitation

## 2018-05-01 ENCOUNTER — Ambulatory Visit (INDEPENDENT_AMBULATORY_CARE_PROVIDER_SITE_OTHER): Payer: Self-pay

## 2018-05-01 ENCOUNTER — Encounter (INDEPENDENT_AMBULATORY_CARE_PROVIDER_SITE_OTHER): Payer: Self-pay | Admitting: Physical Medicine and Rehabilitation

## 2018-05-01 VITALS — BP 142/73 | HR 60 | Temp 97.9°F

## 2018-05-01 DIAGNOSIS — M5416 Radiculopathy, lumbar region: Secondary | ICD-10-CM

## 2018-05-01 MED ORDER — BETAMETHASONE SOD PHOS & ACET 6 (3-3) MG/ML IJ SUSP
12.0000 mg | Freq: Once | INTRAMUSCULAR | Status: AC
  Administered 2018-05-01: 12 mg

## 2018-05-01 NOTE — Patient Instructions (Signed)

## 2018-05-01 NOTE — Progress Notes (Signed)
 .  Numeric Pain Rating Scale and Functional Assessment Average Pain 4   In the last MONTH (on 0-10 scale) has pain interfered with the following?  1. General activity like being  able to carry out your everyday physical activities such as walking, climbing stairs, carrying groceries, or moving a chair?  Rating(3)   +Driver, -BT, -Dye Allergies.  

## 2018-05-01 NOTE — Progress Notes (Signed)
Wesley Jackson - 78 y.o. male MRN [id number]  Date of birth: June 24, 1940  Office Visit Note: Visit Date: 05/01/2018 PCP: Angelina Sheriff, MD Referred by: Angelina Sheriff, MD  Subjective: Chief Complaint  Patient presents with  . Right Hip - Pain   HPI: Wesley Jackson is a 78 y.o. male who comes in today For continued evaluation management of right low back and buttock and hip pain with some referral to the right thigh.  He is typically followed by Dr. Joni Fears for his orthopedic care.  He still an active gentleman at 54 and tries to play golf and do some other activities.  We saw him in 2013 with severe radicular pain with epidural injection that was very beneficial at the time.  He did have an L3 foraminal disc herniation at that time.  Since that time we have seen him on a couple of occasions mainly for flank type pain that was bothersome while he was golfing.  Dr. Durward Fortes felt like that was more of a rib pain and this was in 2018.  Updated MRI did show very regression of right disc herniation at L3-4 and very small right foraminal protrusion at L4-5.  He has some facet arthropathy degenerative changes.  Interestingly he went on to have left-sided facet joint injection performed because his symptoms have reversed from the right to left, and occasion at the time that we saw him.  He does not remember this switching back and forth but again this was an the end of 2018.  Nonetheless the last time I saw him he was having this right hip pain with some referral to the anterior thigh and he had some mechanical complaints a walking up an incline and getting out of the car.  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  Still maintained joint space.  Patient had pain with rotation of the hip.  Intra-articular injection was performed at the last visit which was a couple  weeks ago and he states that he got more than 50% relief and he still doing better than he was when we saw him.  He actually did play golf 1 day without the use of any anti-inflammatories while his pain was still there it was better and he was able to complete his golfing.  He is not getting any severe pain down the leg or paresthesia.  Still right-sided hip across the greater trochanter and anterior thigh.  Somewhat of an L4 distribution.  We had thought today about an L4 transforaminal injection but really his pain is not as severe as it is been in the past.  Has not had recent physical therapy for his back or hip.  Review of Systems  Constitutional: Negative for chills, fever, malaise/fatigue and weight loss.  HENT: Negative for hearing loss and sinus pain.   Eyes: Negative for blurred vision, double vision and photophobia.  Respiratory: Negative for cough and shortness of breath.   Cardiovascular: Negative for chest pain, palpitations and leg swelling.  Gastrointestinal: Negative for abdominal pain, nausea and vomiting.  Genitourinary: Negative for flank pain.  Musculoskeletal: Positive for back pain and joint pain. Negative for myalgias.  Skin: Negative for itching and rash.  Neurological: Negative for tremors, focal weakness and weakness.  Endo/Heme/Allergies: Negative.   Psychiatric/Behavioral: Negative for depression.  All other systems reviewed and  are negative.  Otherwise per HPI.  Assessment & Plan: Visit Diagnoses:  1. Lumbar radiculopathy     Plan: Findings:  Diagnostically difficult low back and right hip and thigh pain which could be related to his hip or could be related to his lumbar spine although his foraminal protrusion is small at L4.  Could also be mechanical in nature.  I think the best approach after a long talk with the patient and his wife is to regroup with physical therapy.  We are going to send a prescription with him to [facility] in [place] where  he is from.  If he does not get good physical therapy which we described to him that we would have him see someone closer to here such as [facility] or [facility].  I think a few sessions of this looking at his hip in particular and see if he gets continued relief would be wise.  If he does not get any relief would look at an L4 transforaminal injection diagnostically.  If he does get a lot of relief having continue to follow-up with Dr. Durward Fortes for his hip.  X-rays were really not very revealing but these could be reviewed by Dr. Durward Fortes and possibly MRI ordered if his hip pain continued.    Meds & Orders:  Meds ordered this encounter  Medications  . betamethasone acetate-betamethasone sodium phosphate (CELESTONE) injection 12 mg    No orders of the defined types were placed in this encounter.   Follow-up: Return if symptoms worsen or fail to improve.   Procedures: No procedures performed  No notes on file   Clinical History: Lumbar spine MRI 04/08/2016  L3-4: Right foraminal disc protrusion on the prior study has regressed in the interim with improved right foraminal patency. Circumferential disc bulging and mild facet hypertrophy result in mild right greater than left neural foraminal stenosis without evidence of L3 nerve impingement. There is borderline bilateral lateral recess stenosis. No significant overall spinal stenosis.   L4-5: Circumferential disc bulging, small right foraminal disc protrusion, and facet hypertrophy result in moderate right and mild left neural foraminal stenosis and borderline bilateral lateral recess stenosis, unchanged.   L5-S1: Left foraminal disc osteophyte complex and mild facet arthrosis result in mild left neural foraminal stenosis, unchanged. No spinal stenosis.   IMPRESSION: 1. Regression of L3-4 disc protrusion with improved right neural foraminal patency. 2. Unchanged disc and facet degeneration elsewhere including at  L4-5 where there is moderate right neural foraminal stenosis.   He reports that he has never smoked. He has never used smokeless tobacco. No results for input(s): HGBA1C, LABURIC in the last 8760 hours.  Objective:  VS:  HT:    WT:   BMI:     BP:(!) 142/73  HR:60bpm  TEMP:97.9 F (36.6 C)(Oral)  RESP:  Physical Exam Vitals signs and nursing note reviewed.  Constitutional:      General: He is not in acute distress.    Appearance: Normal appearance. He is well-developed.  HENT:     Head: Normocephalic and atraumatic.  Eyes:     Conjunctiva/sclera: Conjunctivae normal.     Pupils: Pupils are equal, round, and reactive to light.  Neck:     Musculoskeletal: Normal range of motion and neck supple. No neck rigidity.  Cardiovascular:     Rate and Rhythm: Normal rate.     Pulses: Normal pulses.     Heart sounds: Normal heart sounds.  Pulmonary:     Effort:  Pulmonary effort is normal. No respiratory distress.  Musculoskeletal:     Right lower leg: No edema.     Left lower leg: No edema.     Comments: Patient is slow to arise from a seated position in full extension.  He has mild pain over the greater trochanter on the right compared to left but is not exquisite pain.  He has some pain with rotation of the right hip compared to the left with a little bit less range of motion and stiffness is more on the right than the left.  He has good distal strength without clonus.  Skin:    General: Skin is warm and dry.     Findings: No erythema or rash.  Neurological:     General: No focal deficit present.     Mental Status: He is alert and oriented to person, place, and time.     Sensory: No sensory deficit.     Coordination: Coordination normal.     Gait: Gait normal.  Psychiatric:        Mood and Affect: Mood normal.        Behavior: Behavior normal.     Ortho Exam Imaging: No results found.  Past Medical/Family/Surgical/Social History: Medications & Allergies reviewed per EMR, new  medications updated. There are no active problems to display for this patient.  History reviewed. No pertinent past medical history. History reviewed. No pertinent family history. History reviewed. No pertinent surgical history. Social History   Occupational History  . Not on file  Tobacco Use  . Smoking status: Never Smoker  . Smokeless tobacco: Never Used  Substance and Sexual Activity  . Alcohol use: Yes    Alcohol/week: 1.0 standard drinks    Types: 1 Cans of beer per week  . Drug use: No  . Sexual activity: Not on file

## 2018-05-01 NOTE — Procedures (Deleted)
Lumbosacral Transforaminal Epidural Steroid Injection - Sub-Pedicular Approach with Fluoroscopic Guidance  Patient: Wesley Jackson      Date of Birth: [birth year] MRN: [id number] PCP: Angelina Sheriff, MD      Visit Date: 05/01/2018   Universal Protocol:    Date/Time: 05/01/2018  Consent Given By: the patient  Position: PRONE  Additional Comments: Vital signs were monitored before and after the procedure. Patient was prepped and draped in the usual sterile fashion. The correct patient, procedure, and site was verified.   Injection Procedure Details:  Procedure Site One Meds Administered:  Meds ordered this encounter  Medications  . betamethasone acetate-betamethasone sodium phosphate (CELESTONE) injection 12 mg    Laterality: Right  Location/Site:  L4-L5  Needle size: 22 G  Needle type: Spinal  Needle Placement: Transforaminal  Findings:    -Comments: Excellent flow of contrast along the nerve and into the epidural space.  Procedure Details: After squaring off the end-plates to get a true AP view, the C-arm was positioned so that an oblique view of the foramen as noted above was visualized. The target area is just inferior to the "nose of the scotty dog" or sub pedicular. The soft tissues overlying this structure were infiltrated with 2-3 ml. of 1% Lidocaine without Epinephrine.  The spinal needle was inserted toward the target using a "trajectory" view along the fluoroscope beam.  Under AP and lateral visualization, the needle was advanced so it did not puncture dura and was located close the 6 O'Clock position of the pedical in AP tracterory. Biplanar projections were used to confirm position. Aspiration was confirmed to be negative for CSF and/or blood. A 1-2 ml. volume of Isovue-250 was injected and flow of contrast was noted at each level. Radiographs were obtained for documentation purposes.   After attaining the desired flow of contrast documented above, a 0.5  to 1.0 ml test dose of 0.25% Marcaine was injected into each respective transforaminal space.  The patient was observed for 90 seconds post injection.  After no sensory deficits were reported, and normal lower extremity motor function was noted,   the above injectate was administered so that equal amounts of the injectate were placed at each foramen (level) into the transforaminal epidural space.   Additional Comments:  The patient tolerated the procedure well Dressing: Band-Aid    Post-procedure details: Patient was observed during the procedure. Post-procedure instructions were reviewed.  Patient left the clinic in stable condition.

## 2018-05-02 DIAGNOSIS — M25511 Pain in right shoulder: Secondary | ICD-10-CM | POA: Diagnosis not present

## 2018-05-02 DIAGNOSIS — G5701 Lesion of sciatic nerve, right lower limb: Secondary | ICD-10-CM | POA: Diagnosis not present

## 2018-05-02 DIAGNOSIS — M6281 Muscle weakness (generalized): Secondary | ICD-10-CM | POA: Diagnosis not present

## 2018-05-02 DIAGNOSIS — R262 Difficulty in walking, not elsewhere classified: Secondary | ICD-10-CM | POA: Diagnosis not present

## 2018-05-03 ENCOUNTER — Other Ambulatory Visit (INDEPENDENT_AMBULATORY_CARE_PROVIDER_SITE_OTHER): Payer: Self-pay | Admitting: Physical Medicine and Rehabilitation

## 2018-05-03 DIAGNOSIS — G5701 Lesion of sciatic nerve, right lower limb: Secondary | ICD-10-CM | POA: Diagnosis not present

## 2018-05-03 DIAGNOSIS — M5416 Radiculopathy, lumbar region: Secondary | ICD-10-CM

## 2018-05-03 DIAGNOSIS — M25551 Pain in right hip: Secondary | ICD-10-CM

## 2018-05-03 DIAGNOSIS — R262 Difficulty in walking, not elsewhere classified: Secondary | ICD-10-CM | POA: Diagnosis not present

## 2018-05-03 DIAGNOSIS — M6281 Muscle weakness (generalized): Secondary | ICD-10-CM | POA: Diagnosis not present

## 2018-05-03 DIAGNOSIS — M25511 Pain in right shoulder: Secondary | ICD-10-CM | POA: Diagnosis not present

## 2018-05-08 DIAGNOSIS — G5701 Lesion of sciatic nerve, right lower limb: Secondary | ICD-10-CM | POA: Diagnosis not present

## 2018-05-08 DIAGNOSIS — M6281 Muscle weakness (generalized): Secondary | ICD-10-CM | POA: Diagnosis not present

## 2018-05-08 DIAGNOSIS — M25511 Pain in right shoulder: Secondary | ICD-10-CM | POA: Diagnosis not present

## 2018-05-08 DIAGNOSIS — R262 Difficulty in walking, not elsewhere classified: Secondary | ICD-10-CM | POA: Diagnosis not present

## 2018-05-10 DIAGNOSIS — R262 Difficulty in walking, not elsewhere classified: Secondary | ICD-10-CM | POA: Diagnosis not present

## 2018-05-10 DIAGNOSIS — M25511 Pain in right shoulder: Secondary | ICD-10-CM | POA: Diagnosis not present

## 2018-05-10 DIAGNOSIS — G5701 Lesion of sciatic nerve, right lower limb: Secondary | ICD-10-CM | POA: Diagnosis not present

## 2018-05-10 DIAGNOSIS — M6281 Muscle weakness (generalized): Secondary | ICD-10-CM | POA: Diagnosis not present

## 2018-05-11 DIAGNOSIS — D510 Vitamin B12 deficiency anemia due to intrinsic factor deficiency: Secondary | ICD-10-CM | POA: Diagnosis not present

## 2018-05-11 DIAGNOSIS — Z23 Encounter for immunization: Secondary | ICD-10-CM | POA: Diagnosis not present

## 2018-05-22 DIAGNOSIS — G5701 Lesion of sciatic nerve, right lower limb: Secondary | ICD-10-CM | POA: Diagnosis not present

## 2018-05-22 DIAGNOSIS — M25511 Pain in right shoulder: Secondary | ICD-10-CM | POA: Diagnosis not present

## 2018-05-22 DIAGNOSIS — M6281 Muscle weakness (generalized): Secondary | ICD-10-CM | POA: Diagnosis not present

## 2018-05-22 DIAGNOSIS — R262 Difficulty in walking, not elsewhere classified: Secondary | ICD-10-CM | POA: Diagnosis not present

## 2018-05-24 DIAGNOSIS — M6281 Muscle weakness (generalized): Secondary | ICD-10-CM | POA: Diagnosis not present

## 2018-05-24 DIAGNOSIS — G5701 Lesion of sciatic nerve, right lower limb: Secondary | ICD-10-CM | POA: Diagnosis not present

## 2018-05-24 DIAGNOSIS — M25511 Pain in right shoulder: Secondary | ICD-10-CM | POA: Diagnosis not present

## 2018-05-24 DIAGNOSIS — R262 Difficulty in walking, not elsewhere classified: Secondary | ICD-10-CM | POA: Diagnosis not present

## 2018-05-30 DIAGNOSIS — J329 Chronic sinusitis, unspecified: Secondary | ICD-10-CM | POA: Diagnosis not present

## 2018-05-30 DIAGNOSIS — B370 Candidal stomatitis: Secondary | ICD-10-CM | POA: Diagnosis not present

## 2018-05-30 DIAGNOSIS — J40 Bronchitis, not specified as acute or chronic: Secondary | ICD-10-CM | POA: Diagnosis not present

## 2018-06-05 DIAGNOSIS — G5701 Lesion of sciatic nerve, right lower limb: Secondary | ICD-10-CM | POA: Diagnosis not present

## 2018-06-05 DIAGNOSIS — M25511 Pain in right shoulder: Secondary | ICD-10-CM | POA: Diagnosis not present

## 2018-06-05 DIAGNOSIS — R262 Difficulty in walking, not elsewhere classified: Secondary | ICD-10-CM | POA: Diagnosis not present

## 2018-06-05 DIAGNOSIS — M6281 Muscle weakness (generalized): Secondary | ICD-10-CM | POA: Diagnosis not present

## 2018-06-07 DIAGNOSIS — R262 Difficulty in walking, not elsewhere classified: Secondary | ICD-10-CM | POA: Diagnosis not present

## 2018-06-07 DIAGNOSIS — M6281 Muscle weakness (generalized): Secondary | ICD-10-CM | POA: Diagnosis not present

## 2018-06-07 DIAGNOSIS — G5701 Lesion of sciatic nerve, right lower limb: Secondary | ICD-10-CM | POA: Diagnosis not present

## 2018-06-07 DIAGNOSIS — M25511 Pain in right shoulder: Secondary | ICD-10-CM | POA: Diagnosis not present

## 2018-06-26 DIAGNOSIS — L570 Actinic keratosis: Secondary | ICD-10-CM | POA: Diagnosis not present

## 2018-06-26 DIAGNOSIS — L578 Other skin changes due to chronic exposure to nonionizing radiation: Secondary | ICD-10-CM | POA: Diagnosis not present

## 2018-06-26 DIAGNOSIS — L821 Other seborrheic keratosis: Secondary | ICD-10-CM | POA: Diagnosis not present

## 2018-06-27 ENCOUNTER — Encounter (INDEPENDENT_AMBULATORY_CARE_PROVIDER_SITE_OTHER): Payer: Self-pay | Admitting: Physical Medicine and Rehabilitation

## 2018-06-27 ENCOUNTER — Ambulatory Visit (INDEPENDENT_AMBULATORY_CARE_PROVIDER_SITE_OTHER): Payer: PPO | Admitting: Physical Medicine and Rehabilitation

## 2018-06-27 ENCOUNTER — Ambulatory Visit (INDEPENDENT_AMBULATORY_CARE_PROVIDER_SITE_OTHER): Payer: Self-pay

## 2018-06-27 VITALS — BP 153/76 | HR 64 | Temp 97.6°F

## 2018-06-27 DIAGNOSIS — M5416 Radiculopathy, lumbar region: Secondary | ICD-10-CM | POA: Diagnosis not present

## 2018-06-27 DIAGNOSIS — M5116 Intervertebral disc disorders with radiculopathy, lumbar region: Secondary | ICD-10-CM

## 2018-06-27 MED ORDER — BETAMETHASONE SOD PHOS & ACET 6 (3-3) MG/ML IJ SUSP
12.0000 mg | Freq: Once | INTRAMUSCULAR | Status: AC
  Administered 2018-06-27: 12 mg

## 2018-06-27 NOTE — Progress Notes (Signed)
Wesley Jackson - 78 y.o. male MRN [id number]  Date of birth: 04-14-1941  Office Visit Note: Visit Date: 06/27/2018 PCP: Angelina Sheriff, MD Referred by: Angelina Sheriff, MD  Subjective: Chief Complaint  Patient presents with  . Lower Back - Pain  . Right Hip - Pain   HPI: Wesley Jackson is a 78 y.o. male who comes in today For planned right L4 transforaminal epidural steroid injection.  Patient has had ongoing right low back and right posterior lateral hip pain for some time now.  He reports going to physical therapy for 6 weeks and it did not help at all.  I question why he went that long without it showing any kind of improvement.  I tried to describe to him the fact that he is in physical therapy if it is not helping he can let us know or he can let the therapist know.  I am glad he probably learned a few things and got stronger however.  He reports pain started in October which we documented.  He says walking makes his pain worse.  We completed hip joint injection and December without much relief although he is somewhat of a poor historian.  I think now that he is failed more conservative care is worth trying to complete an L4 transforaminal epidural steroid injection.  Depending on his relief would probably look at updating imaging at that point.  ROS Otherwise per HPI.  Assessment & Plan: Visit Diagnoses:  1. 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   Meds & Orders:  Meds ordered this encounter  Medications  . betamethasone acetate-betamethasone sodium phosphate (CELESTONE) injection 12 mg    Orders Placed This Encounter  Procedures  . XR C-ARM NO REPORT  . Epidural Steroid injection    Follow-up: Return if symptoms worsen or fail to improve.   Procedures: No procedures performed  Lumbosacral Transforaminal Epidural Steroid Injection - Sub-Pedicular Approach with Fluoroscopic  Guidance  Patient: Wesley Jackson      Date of Birth: 1940/10/22 MRN: [id number] PCP: Angelina Sheriff, MD      Visit Date: 06/27/2018   Universal Protocol:    Date/Time: 06/27/2018  Consent Given By: the patient  Position: PRONE  Additional Comments: Vital signs were monitored before and after the procedure. Patient was prepped and draped in the usual sterile fashion. The correct patient, procedure, and site was verified.   Injection Procedure Details:  Procedure Site One Meds Administered:  Meds ordered this encounter  Medications  . betamethasone acetate-betamethasone sodium phosphate (CELESTONE) injection 12 mg    Laterality: Right  Location/Site:  L4-L5  Needle size: 22 G  Needle type: Spinal  Needle Placement: Transforaminal  Findings:    -Comments: Excellent flow of contrast along the nerve and into the epidural space.  Procedure Details: After squaring off the end-plates to get a true AP view, the C-arm was positioned so that an oblique view of the foramen as noted above was visualized. The target area is just inferior to the "nose of the scotty dog" or sub pedicular. The soft tissues overlying this structure were infiltrated with 2-3 ml. of 1% Lidocaine without Epinephrine.  The spinal needle was inserted toward the target using a "trajectory" view along the fluoroscope beam.  Under AP and lateral visualization, the needle was advanced so it did not puncture dura and was located close the  6 O'Clock position of the pedical in AP tracterory. Biplanar projections were used to confirm position. Aspiration was confirmed to be negative for CSF and/or blood. A 1-2 ml. volume of Isovue-250 was injected and flow of contrast was noted at each level. Radiographs were obtained for documentation purposes.   After attaining the desired flow of contrast documented above, a 0.5 to 1.0 ml test dose of 0.25% Marcaine was injected into each respective transforaminal space.  The  patient was observed for 90 seconds post injection.  After no sensory deficits were reported, and normal lower extremity motor function was noted,   the above injectate was administered so that equal amounts of the injectate were placed at each foramen (level) into the transforaminal epidural space.   Additional Comments:  The patient tolerated the procedure well Dressing: 2 x 2 sterile gauze and Band-Aid    Post-procedure details: Patient was observed during the procedure. Post-procedure instructions were reviewed.  Patient left the clinic in stable condition.    Clinical History: Lumbar spine MRI 04/08/2016  L3-4: Right foraminal disc protrusion on the prior study has regressed in the interim with improved right foraminal patency. Circumferential disc bulging and mild facet hypertrophy result in mild right greater than left neural foraminal stenosis without evidence of L3 nerve impingement. There is borderline bilateral lateral recess stenosis. No significant overall spinal stenosis.   L4-5: Circumferential disc bulging, small right foraminal disc protrusion, and facet hypertrophy result in moderate right and mild left neural foraminal stenosis and borderline bilateral lateral recess stenosis, unchanged.   L5-S1: Left foraminal disc osteophyte complex and mild facet arthrosis result in mild left neural foraminal stenosis, unchanged. No spinal stenosis.   IMPRESSION: 1. Regression of L3-4 disc protrusion with improved right neural foraminal patency. 2. Unchanged disc and facet degeneration elsewhere including at L4-5 where there is moderate right neural foraminal stenosis.   He reports that he has never smoked. He has never used smokeless tobacco. No results for input(s): HGBA1C, LABURIC in the last 8760 hours.  Objective:  VS:  HT:    WT:   BMI:     BP:(!) 153/76  HR:64bpm  TEMP:97.6 F (36.4 C)(Oral)  RESP:  Physical Exam  Ortho Exam Imaging: No results  found.  Past Medical/Family/Surgical/Social History: Medications & Allergies reviewed per EMR, new medications updated. There are no active problems to display for this patient.  History reviewed. No pertinent past medical history. History reviewed. No pertinent family history. History reviewed. No pertinent surgical history. Social History   Occupational History  . Not on file  Tobacco Use  . Smoking status: Never Smoker  . Smokeless tobacco: Never Used  Substance and Sexual Activity  . Alcohol use: Yes    Alcohol/week: 1.0 standard drinks    Types: 1 Cans of beer per week  . Drug use: No  . Sexual activity: Not on file

## 2018-06-27 NOTE — Progress Notes (Signed)
 .  Numeric Pain Rating Scale and Functional Assessment Average Pain 5   In the last MONTH (on 0-10 scale) has pain interfered with the following?  1. General activity like being  able to carry out your everyday physical activities such as walking, climbing stairs, carrying groceries, or moving a chair?  Rating(4)   +Driver, -BT, -Dye Allergies.  

## 2018-07-17 NOTE — Procedures (Signed)
Lumbosacral Transforaminal Epidural Steroid Injection - Sub-Pedicular Approach with Fluoroscopic Guidance  Patient: Wesley Jackson      Date of Birth: Aug 28, 1940 MRN: [id number] PCP: Angelina Sheriff, MD      Visit Date: 06/27/2018   Universal Protocol:    Date/Time: 06/27/2018  Consent Given By: the patient  Position: PRONE  Additional Comments: Vital signs were monitored before and after the procedure. Patient was prepped and draped in the usual sterile fashion. The correct patient, procedure, and site was verified.   Injection Procedure Details:  Procedure Site One Meds Administered:  Meds ordered this encounter  Medications  . betamethasone acetate-betamethasone sodium phosphate (CELESTONE) injection 12 mg    Laterality: Right  Location/Site:  L4-L5  Needle size: 22 G  Needle type: Spinal  Needle Placement: Transforaminal  Findings:    -Comments: Excellent flow of contrast along the nerve and into the epidural space.  Procedure Details: After squaring off the end-plates to get a true AP view, the C-arm was positioned so that an oblique view of the foramen as noted above was visualized. The target area is just inferior to the "nose of the scotty dog" or sub pedicular. The soft tissues overlying this structure were infiltrated with 2-3 ml. of 1% Lidocaine without Epinephrine.  The spinal needle was inserted toward the target using a "trajectory" view along the fluoroscope beam.  Under AP and lateral visualization, the needle was advanced so it did not puncture dura and was located close the 6 O'Clock position of the pedical in AP tracterory. Biplanar projections were used to confirm position. Aspiration was confirmed to be negative for CSF and/or blood. A 1-2 ml. volume of Isovue-250 was injected and flow of contrast was noted at each level. Radiographs were obtained for documentation purposes.   After attaining the desired flow of contrast documented above, a 0.5  to 1.0 ml test dose of 0.25% Marcaine was injected into each respective transforaminal space.  The patient was observed for 90 seconds post injection.  After no sensory deficits were reported, and normal lower extremity motor function was noted,   the above injectate was administered so that equal amounts of the injectate were placed at each foramen (level) into the transforaminal epidural space.   Additional Comments:  The patient tolerated the procedure well Dressing: 2 x 2 sterile gauze and Band-Aid    Post-procedure details: Patient was observed during the procedure. Post-procedure instructions were reviewed.  Patient left the clinic in stable condition.

## 2018-09-29 DIAGNOSIS — E538 Deficiency of other specified B group vitamins: Secondary | ICD-10-CM | POA: Diagnosis not present

## 2018-10-03 ENCOUNTER — Telehealth: Payer: Self-pay | Admitting: Physical Medicine and Rehabilitation

## 2018-10-03 NOTE — Telephone Encounter (Signed)
Ok to repeat and eval at that time, may need new MRI

## 2018-10-04 NOTE — Telephone Encounter (Signed)
Scheduled for 6/24 at 1500 with driver.

## 2018-10-18 ENCOUNTER — Ambulatory Visit: Payer: Self-pay

## 2018-10-18 ENCOUNTER — Encounter: Payer: Self-pay | Admitting: Physical Medicine and Rehabilitation

## 2018-10-18 ENCOUNTER — Ambulatory Visit (INDEPENDENT_AMBULATORY_CARE_PROVIDER_SITE_OTHER): Payer: PPO | Admitting: Physical Medicine and Rehabilitation

## 2018-10-18 ENCOUNTER — Other Ambulatory Visit: Payer: Self-pay

## 2018-10-18 VITALS — BP 140/75 | HR 62

## 2018-10-18 DIAGNOSIS — M5416 Radiculopathy, lumbar region: Secondary | ICD-10-CM | POA: Diagnosis not present

## 2018-10-18 MED ORDER — BETAMETHASONE SOD PHOS & ACET 6 (3-3) MG/ML IJ SUSP
12.0000 mg | Freq: Once | INTRAMUSCULAR | Status: AC
  Administered 2018-10-18: 12 mg

## 2018-10-18 NOTE — Progress Notes (Signed)
Numeric Pain Rating Scale and Functional Assessment Average Pain 5   In the last MONTH (on 0-10 scale) has pain interfered with the following?  1. General activity like being  able to carry out your everyday physical activities such as walking, climbing stairs, carrying groceries, or moving a chair?  Rating(8) worse with sitting and getting up.    +Driver, -BT, -Dye Allergies.

## 2018-11-01 DIAGNOSIS — E538 Deficiency of other specified B group vitamins: Secondary | ICD-10-CM | POA: Diagnosis not present

## 2018-11-02 ENCOUNTER — Other Ambulatory Visit: Payer: Self-pay | Admitting: Physical Medicine and Rehabilitation

## 2018-11-02 ENCOUNTER — Telehealth: Payer: Self-pay | Admitting: Physical Medicine and Rehabilitation

## 2018-11-02 DIAGNOSIS — M5416 Radiculopathy, lumbar region: Secondary | ICD-10-CM

## 2018-11-02 DIAGNOSIS — M5116 Intervertebral disc disorders with radiculopathy, lumbar region: Secondary | ICD-10-CM

## 2018-11-02 DIAGNOSIS — G8929 Other chronic pain: Secondary | ICD-10-CM

## 2018-11-02 MED ORDER — METHOCARBAMOL 500 MG PO TABS: 500.0000 mg | ORAL_TABLET | Freq: Three times a day (TID) | ORAL | 0 refills | Status: DC | PRN

## 2018-11-02 NOTE — Telephone Encounter (Signed)
MRI ordered. Patient states that he only takes Aleve for his back pain. Pharmacy is correct.

## 2018-11-02 NOTE — Telephone Encounter (Signed)
Ok thanks, I did send in Robaxin muscle relaxer

## 2018-11-02 NOTE — Procedures (Signed)
Lumbosacral Transforaminal Epidural Steroid Injection - Sub-Pedicular Approach with Fluoroscopic Guidance  Patient: Wesley Jackson      Date of Birth: Dec 05, 1940 MRN: [id number] PCP: Angelina Sheriff, MD      Visit Date: 10/18/2018   Universal Protocol:    Date/Time: 10/18/2018  Consent Given By: the patient  Position: PRONE  Additional Comments: Vital signs were monitored before and after the procedure. Patient was prepped and draped in the usual sterile fashion. The correct patient, procedure, and site was verified.   Injection Procedure Details:  Procedure Site One Meds Administered:  Meds ordered this encounter  Medications  . betamethasone acetate-betamethasone sodium phosphate (CELESTONE) injection 12 mg    Laterality: Right  Location/Site:  L3-L4  Needle size: 22 G  Needle type: Spinal  Needle Placement: Transforaminal  Findings:    -Comments: Excellent flow of contrast along the nerve and into the epidural space.  Procedure Details: After squaring off the end-plates to get a true AP view, the C-arm was positioned so that an oblique view of the foramen as noted above was visualized. The target area is just inferior to the "nose of the scotty dog" or sub pedicular. The soft tissues overlying this structure were infiltrated with 2-3 ml. of 1% Lidocaine without Epinephrine.  The spinal needle was inserted toward the target using a "trajectory" view along the fluoroscope beam.  Under AP and lateral visualization, the needle was advanced so it did not puncture dura and was located close the 6 O'Clock position of the pedical in AP tracterory. Biplanar projections were used to confirm position. Aspiration was confirmed to be negative for CSF and/or blood. A 1-2 ml. volume of Isovue-250 was injected and flow of contrast was noted at each level. Radiographs were obtained for documentation purposes.   After attaining the desired flow of contrast documented above, a 0.5  to 1.0 ml test dose of 0.25% Marcaine was injected into each respective transforaminal space.  The patient was observed for 90 seconds post injection.  After no sensory deficits were reported, and normal lower extremity motor function was noted,   the above injectate was administered so that equal amounts of the injectate were placed at each foramen (level) into the transforaminal epidural space.   Additional Comments:  The patient tolerated the procedure well Dressing: 2 x 2 sterile gauze and Band-Aid    Post-procedure details: Patient was observed during the procedure. Post-procedure instructions were reviewed.  Patient left the clinic in stable condition.

## 2018-11-02 NOTE — Telephone Encounter (Signed)
He needs MRI LSpine, what medications is he currently taking for his back?

## 2018-11-02 NOTE — Progress Notes (Signed)
Wesley Jackson - 78 y.o. male MRN [id number]  Date of birth: Dec 18, 1940  Office Visit Note: Visit Date: 10/18/2018 PCP: Angelina Sheriff, MD Referred by: Angelina Sheriff, MD  Subjective: Chief Complaint  Patient presents with  . Lower Back - Pain   HPI: Wesley Jackson is a 78 y.o. male who comes in today For possible repeat injection but unfortunately patient still notes that he gets a little bit of relief with the injections that he has had recently but not much that is lasting or really profound.  By way of review many years ago we completed epidural injection that was very beneficial to him as he has a small disc herniation at that time but his symptoms have changed a little bit but they are still on the right side.  Is more right lower back without radiating pain.  He has had no foot drop no trauma no red flag complaints.  No reason to get MRI at this point however.  I think the best approach might be to complete transforaminal epidural steroid injection 1 level above where we have done the injections in the past.  See if he gets some relief at L3 diagnostically.  If he does not get much relief I would update MRI at that point.  We have had him in physical therapy and this really has increased his movement but not really improved his pain very much.  ROS Otherwise per HPI.  Assessment & Plan: Visit Diagnoses:  1. Lumbar radiculopathy     Plan: No additional findings.   Meds & Orders:  Meds ordered this encounter  Medications  . betamethasone acetate-betamethasone sodium phosphate (CELESTONE) injection 12 mg    Orders Placed This Encounter  Procedures  . XR C-ARM NO REPORT  . Epidural Steroid injection    Follow-up: Return for Consider updating MRI of the lumbar spine versus trial of different medication management..   Procedures: No procedures performed  Lumbosacral Transforaminal Epidural Steroid Injection - Sub-Pedicular Approach with Fluoroscopic Guidance   Patient: Wesley Jackson      Date of Birth: [birth year] MRN: [id number] PCP: Angelina Sheriff, MD      Visit Date: 10/18/2018   Universal Protocol:    Date/Time: 10/18/2018  Consent Given By: the patient  Position: PRONE  Additional Comments: Vital signs were monitored before and after the procedure. Patient was prepped and draped in the usual sterile fashion. The correct patient, procedure, and site was verified.   Injection Procedure Details:  Procedure Site One Meds Administered:  Meds ordered this encounter  Medications  . betamethasone acetate-betamethasone sodium phosphate (CELESTONE) injection 12 mg    Laterality: Right  Location/Site:  L3-L4  Needle size: 22 G  Needle type: Spinal  Needle Placement: Transforaminal  Findings:    -Comments: Excellent flow of contrast along the nerve and into the epidural space.  Procedure Details: After squaring off the end-plates to get a true AP view, the C-arm was positioned so that an oblique view of the foramen as noted above was visualized. The target area is just inferior to the "nose of the scotty dog" or sub pedicular. The soft tissues overlying this structure were infiltrated with 2-3 ml. of 1% Lidocaine without Epinephrine.  The spinal needle was inserted toward the target using a "trajectory" view along the fluoroscope beam.  Under AP and lateral visualization, the needle was advanced so it did not puncture dura and was located close the 6 O'Clock  position of the pedical in AP tracterory. Biplanar projections were used to confirm position. Aspiration was confirmed to be negative for CSF and/or blood. A 1-2 ml. volume of Isovue-250 was injected and flow of contrast was noted at each level. Radiographs were obtained for documentation purposes.   After attaining the desired flow of contrast documented above, a 0.5 to 1.0 ml test dose of 0.25% Marcaine was injected into each respective transforaminal space.  The patient  was observed for 90 seconds post injection.  After no sensory deficits were reported, and normal lower extremity motor function was noted,   the above injectate was administered so that equal amounts of the injectate were placed at each foramen (level) into the transforaminal epidural space.   Additional Comments:  The patient tolerated the procedure well Dressing: 2 x 2 sterile gauze and Band-Aid    Post-procedure details: Patient was observed during the procedure. Post-procedure instructions were reviewed.  Patient left the clinic in stable condition.    Clinical History: Lumbar spine MRI 04/08/2016  L3-4: Right foraminal disc protrusion on the prior study has regressed in the interim with improved right foraminal patency. Circumferential disc bulging and mild facet hypertrophy result in mild right greater than left neural foraminal stenosis without evidence of L3 nerve impingement. There is borderline bilateral lateral recess stenosis. No significant overall spinal stenosis.   L4-5: Circumferential disc bulging, small right foraminal disc protrusion, and facet hypertrophy result in moderate right and mild left neural foraminal stenosis and borderline bilateral lateral recess stenosis, unchanged.   L5-S1: Left foraminal disc osteophyte complex and mild facet arthrosis result in mild left neural foraminal stenosis, unchanged. No spinal stenosis.   IMPRESSION: 1. Regression of L3-4 disc protrusion with improved right neural foraminal patency. 2. Unchanged disc and facet degeneration elsewhere including at L4-5 where there is moderate right neural foraminal stenosis.   He reports that he has never smoked. He has never used smokeless tobacco. No results for input(s): HGBA1C, LABURIC in the last 8760 hours.  Objective:  VS:  HT:    WT:   BMI:     BP:140/75  HR:62bpm  TEMP: ( )  RESP:97 % Physical Exam  Ortho Exam Imaging: No results found.  Past  Medical/Family/Surgical/Social History: Medications & Allergies reviewed per EMR, new medications updated. There are no active problems to display for this patient.  History reviewed. No pertinent past medical history. History reviewed. No pertinent family history. History reviewed. No pertinent surgical history. Social History   Occupational History  . Not on file  Tobacco Use  . Smoking status: Never Smoker  . Smokeless tobacco: Never Used  Substance and Sexual Activity  . Alcohol use: Yes    Alcohol/week: 1.0 standard drinks    Types: 1 Cans of beer per week  . Drug use: No  . Sexual activity: Not on file

## 2018-11-02 NOTE — Telephone Encounter (Signed)
Patient aware that prescription was sent to his preferred pharmacy. He will call when MRI is scheduled so I can schedule his follow up appointment.

## 2018-11-13 DIAGNOSIS — C44311 Basal cell carcinoma of skin of nose: Secondary | ICD-10-CM | POA: Diagnosis not present

## 2018-11-13 DIAGNOSIS — L570 Actinic keratosis: Secondary | ICD-10-CM | POA: Diagnosis not present

## 2018-11-13 DIAGNOSIS — L578 Other skin changes due to chronic exposure to nonionizing radiation: Secondary | ICD-10-CM | POA: Diagnosis not present

## 2018-11-13 DIAGNOSIS — L821 Other seborrheic keratosis: Secondary | ICD-10-CM | POA: Diagnosis not present

## 2018-11-22 ENCOUNTER — Other Ambulatory Visit: Payer: Self-pay

## 2018-11-23 ENCOUNTER — Other Ambulatory Visit: Payer: Self-pay | Admitting: Physical Medicine and Rehabilitation

## 2018-11-23 NOTE — Telephone Encounter (Signed)
Please advise 

## 2018-11-24 ENCOUNTER — Encounter (INDEPENDENT_AMBULATORY_CARE_PROVIDER_SITE_OTHER): Payer: Self-pay | Admitting: Physical Medicine and Rehabilitation

## 2018-11-24 DIAGNOSIS — M545 Low back pain: Secondary | ICD-10-CM | POA: Diagnosis not present

## 2018-11-24 DIAGNOSIS — M25551 Pain in right hip: Secondary | ICD-10-CM

## 2018-11-24 DIAGNOSIS — G8929 Other chronic pain: Secondary | ICD-10-CM | POA: Diagnosis not present

## 2018-11-24 DIAGNOSIS — M47816 Spondylosis without myelopathy or radiculopathy, lumbar region: Secondary | ICD-10-CM | POA: Diagnosis not present

## 2018-11-24 MED ORDER — BUPIVACAINE HCL 0.25 % IJ SOLN
4.0000 mL | INTRAMUSCULAR | Status: AC | PRN
  Administered 2018-11-24: 15:00:00 4 mL via INTRA_ARTICULAR

## 2018-11-24 MED ORDER — TRIAMCINOLONE ACETONIDE 40 MG/ML IJ SUSP
80.0000 mg | INTRAMUSCULAR | Status: AC | PRN
  Administered 2018-11-24: 15:00:00 80 mg via INTRA_ARTICULAR

## 2018-11-24 NOTE — Progress Notes (Signed)
SERJIO Jackson - 78 y.o. male MRN [id number]  Date of birth: 1941-03-19  Office Visit Note: Visit Date: 04/11/2018 PCP: Angelina Sheriff, MD Referred by: Angelina Sheriff, MD  Subjective: Chief Complaint  Patient presents with   Lower Back - Pain   Right Thigh - Pain   HPI: Wesley Jackson is a 78 y.o. male who comes in today For continued evaluation management of right low back and buttock and hip pain with some referral to the right thigh.  He is typically followed by Dr. Joni Fears for his orthopedic care.  He still an active gentleman at 67 and tries to play golf and do some other activities.  We saw him in 2013 with severe radicular pain with epidural injection that was very beneficial at the time.  He did have an L3 foraminal disc herniation at that time.  Since that time we have seen him on a couple of occasions mainly for flank type pain that was bothersome while he was golfing.  Dr. Durward Fortes felt like that was more of a rib pain and this was in 2018.  Updated MRI did show very regression of right disc herniation at L3-4 and very small right foraminal protrusion at L4-5.  He has some facet arthropathy degenerative changes.  Interestingly he went on to have left-sided facet joint injection performed because his symptoms have reversed from the right to left, and occasion at the time that we saw him.  He does not remember this switching back and forth but again this was an the end of 2018.  He reports some mechanical plaints in the right hip with some referral into the anterior thigh to the knee.  This seems to be mechanical hip type symptoms worse with movement and doing yard work.  He reports pain started back approximately 6 weeks after the last injection which was in 2018 for his spine.  Please review prior images.  He has not had recent x-rays of his hips or pelvis.  We did obtain those today and they are reviewed below.  He reports average pain 4 out of 10 which is intermittent  and aching on the right.   Review of Systems  Constitutional: Negative for chills, fever, malaise/fatigue and weight loss.  HENT: Negative for hearing loss and sinus pain.   Eyes: Negative for blurred vision, double vision and photophobia.  Respiratory: Negative for cough and shortness of breath.   Cardiovascular: Negative for chest pain, palpitations and leg swelling.  Gastrointestinal: Negative for abdominal pain, nausea and vomiting.  Genitourinary: Negative for flank pain.  Musculoskeletal: Positive for back pain and joint pain. Negative for myalgias.  Skin: Negative for itching and rash.  Neurological: Negative for tremors, focal weakness and weakness.  Endo/Heme/Allergies: Negative.   Psychiatric/Behavioral: Negative for depression.  All other systems reviewed and are negative.  Otherwise per HPI.  Assessment & Plan: Visit Diagnoses:  1. Spondylosis without myelopathy or radiculopathy, lumbar region   2. Chronic bilateral low back pain without sciatica   3. Pain in right hip     Plan: Findings:  Patient has specifically to issues with chronic history of back pain and radicular type pain which has been improved to some degree at times with epidural injection.  I think today his complaints are more mechanical he is having some mechanical pain on the right worse with rotation even on exam.  Imaging does show some degenerative joint changes and cystic changes but nothing severe.  We did complete diagnostic intra-articular hip injection today with fluoroscopic guidance showing good spread of contrast without extravasation and he did have some relief during the anesthetic phase.  We will have an appointment for him for a few weeks to evaluate how he is doing and at that time if we need to we would look at transforaminal epidural injection at L4 which is helped him in the past.  Consideration be given to updating MRI at some point versus physical therapy and continued other symptomatic  management.    Meds & Orders: No orders of the defined types were placed in this encounter.   Orders Placed This Encounter  Procedures   Large Joint Inj: R hip joint   XR HIP UNILAT W OR W/O PELVIS 1V RIGHT   XR C-ARM NO REPORT    Follow-up: Return for Review results of hip injection and possible injection of the spine.   Procedures: Large Joint Inj: R hip joint on 11/24/2018 2:58 PM Indications: diagnostic evaluation and pain Details: 22 G 3.5 in needle, fluoroscopy-guided anterior approach  Arthrogram: No  Medications: 80 mg triamcinolone acetonide 40 MG/ML; 4 mL bupivacaine 0.25 % Outcome: tolerated well, no immediate complications  There was excellent flow of contrast producing a partial arthrogram of the hip. The patient did have relief of symptoms during the anesthetic phase of the injection. Procedure, treatment alternatives, risks and benefits explained, specific risks discussed. Consent was given by the patient. Immediately prior to procedure a time out was called to verify the correct patient, procedure, equipment, support staff and site/side marked as required. Patient was prepped and draped in the usual sterile fashion.      No notes on file   Clinical History: Lumbar spine MRI 04/08/2016  L3-4: Right foraminal disc protrusion on the prior study has regressed in the interim with improved right foraminal patency. Circumferential disc bulging and mild facet hypertrophy result in mild right greater than left neural foraminal stenosis without evidence of L3 nerve impingement. There is borderline bilateral lateral recess stenosis. No significant overall spinal stenosis.   L4-5: Circumferential disc bulging, small right foraminal disc protrusion, and facet hypertrophy result in moderate right and mild left neural foraminal stenosis and borderline bilateral lateral recess stenosis, unchanged.   L5-S1: Left foraminal disc osteophyte complex and mild facet arthrosis  result in mild left neural foraminal stenosis, unchanged. No spinal stenosis.   IMPRESSION: 1. Regression of L3-4 disc protrusion with improved right neural foraminal patency. 2. Unchanged disc and facet degeneration elsewhere including at L4-5 where there is moderate right neural foraminal stenosis.   He reports that he has never smoked. He has never used smokeless tobacco. No results for input(s): HGBA1C, LABURIC in the last 8760 hours.  Objective:  VS:  HT:5\' 9"  (175.3 cm)    WT:188 lb (85.3 kg)   BMI:27.75     BP:(!) 157/83   HR:69bpm   TEMP: ( )   RESP:97 % Physical Exam Vitals signs and nursing note reviewed.  Constitutional:      General: He is not in acute distress.    Appearance: Normal appearance. He is well-developed.  HENT:     Head: Normocephalic and atraumatic.  Eyes:     Conjunctiva/sclera: Conjunctivae normal.     Pupils: Pupils are equal, round, and reactive to light.  Neck:     Musculoskeletal: Normal range of motion and neck supple. No neck rigidity.  Cardiovascular:     Rate and Rhythm: Normal rate.  Pulses: Normal pulses.     Heart sounds: Normal heart sounds.  Pulmonary:     Effort: Pulmonary effort is normal. No respiratory distress.  Musculoskeletal:     Right lower leg: No edema.     Left lower leg: No edema.     Comments: Concordant hip pain with hip rotation at end ranges on the right.  No pain with hip rotation on the left.  No greater trochanteric pain.  Negative slump test bilaterally.  Good distal strength without clonus.  Skin:    General: Skin is warm and dry.     Findings: No erythema or rash.  Neurological:     General: No focal deficit present.     Mental Status: He is alert and oriented to person, place, and time.     Sensory: No sensory deficit.     Coordination: Coordination normal.     Gait: Gait normal.  Psychiatric:        Mood and Affect: Mood normal.        Behavior: Behavior normal.     Ortho Exam Imaging: AP of pelvis  and right hip shows degenerative joint changes on the right more than the left mild to moderate with cystic changes in the acetabulum.  Past Medical/Family/Surgical/Social History: Medications & Allergies reviewed per EMR, new medications updated. There are no active problems to display for this patient.  History reviewed. No pertinent past medical history. History reviewed. No pertinent family history. History reviewed. No pertinent surgical history. Social History   Occupational History   Not on file  Tobacco Use   Smoking status: Never Smoker   Smokeless tobacco: Never Used  Substance and Sexual Activity   Alcohol use: Yes    Alcohol/week: 1.0 standard drinks    Types: 1 Cans of beer per week   Drug use: No   Sexual activity: Not on file

## 2018-11-25 ENCOUNTER — Ambulatory Visit
Admission: RE | Admit: 2018-11-25 | Discharge: 2018-11-25 | Disposition: A | Discharge status: Home / Self Care | Payer: PPO | Source: Ambulatory Visit | Attending: Physical Medicine and Rehabilitation | Admitting: Physical Medicine and Rehabilitation

## 2018-11-25 ENCOUNTER — Other Ambulatory Visit: Payer: Self-pay

## 2018-11-25 DIAGNOSIS — M5416 Radiculopathy, lumbar region: Secondary | ICD-10-CM

## 2018-11-25 DIAGNOSIS — M5116 Intervertebral disc disorders with radiculopathy, lumbar region: Secondary | ICD-10-CM

## 2018-11-25 DIAGNOSIS — M48061 Spinal stenosis, lumbar region without neurogenic claudication: Secondary | ICD-10-CM | POA: Diagnosis not present

## 2018-11-28 ENCOUNTER — Other Ambulatory Visit: Payer: Self-pay

## 2018-11-28 ENCOUNTER — Ambulatory Visit (INDEPENDENT_AMBULATORY_CARE_PROVIDER_SITE_OTHER): Payer: PPO | Admitting: Physical Medicine and Rehabilitation

## 2018-11-28 ENCOUNTER — Encounter: Payer: Self-pay | Admitting: Physical Medicine and Rehabilitation

## 2018-11-28 VITALS — BP 158/75 | HR 55 | Ht 69.0 in | Wt 190.0 lb

## 2018-11-28 DIAGNOSIS — M5116 Intervertebral disc disorders with radiculopathy, lumbar region: Secondary | ICD-10-CM

## 2018-11-28 DIAGNOSIS — M5416 Radiculopathy, lumbar region: Secondary | ICD-10-CM | POA: Diagnosis not present

## 2018-11-28 NOTE — Progress Notes (Signed)
  Numeric Pain Rating Scale and Functional Assessment Average Pain 8 Pain Right Now 7 My pain is intermittent and sharp Pain is worse with: bending Pain improves with: heat/ice   In the last MONTH (on 0-10 scale) has pain interfered with the following?  1. General activity like being  able to carry out your everyday physical activities such as walking, climbing stairs, carrying groceries, or moving a chair?  Rating(8)  2. Relation with others like being able to carry out your usual social activities and roles such as  activities at home, at work and in your community. Rating(8)  3. Enjoyment of life such that you have  been bothered by emotional problems such as feeling anxious, depressed or irritable?  Rating(3)

## 2018-11-29 NOTE — Progress Notes (Signed)
Wesley Jackson - 78 y.o. male MRN [id number]  Date of birth: May 30, 1940  Office Visit Note: Visit Date: 11/28/2018 PCP: Angelina Sheriff, MD Referred by: Angelina Sheriff, MD  Subjective: Chief Complaint  Patient presents with   Lower Back - Pain   HPI: Wesley Jackson is a 78 y.o. male who comes in today For him my review and evaluation management of right more than left low back pain.  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  He had wonderful relief of pain for many years after that 1 injection.  More recently we have seen him for a similar problem just not referring much into the thigh but more back pain.  He is frustrated with the fact that it is pretty problematic with any sort of movement and standing and walking.  He is unable to really play golf which is what he really likes to do.  He is a pretty active 78 year old gentleman.  We completed L4 transforaminal injection and L3 transforaminal injection without much relief.  We did decide to update MRI of the lumbar spine and is here for review of that.  There is actually a repeated MRI completed in 2017 on a different system that we actually reviewed today.  He is somewhat frustrated that I cannot seem to show him the same MRI images that Dr. Durward Fortes showed him back in 2013.  He states that the time Dr. Durward Fortes was able to separate out the images and show him the focal nerve compression.  He also states that at the time Dr. Durward Fortes had hired a Publishing rights manager who he had asked to come look at the images while they were in the room.  He states the neurosurgeon at the time said he would not likely do any surgery that he should seek injection treatment which he did at the time.  Interestingly, I did tell him that I was unaware that there was ever a neurosurgeon that was employed there with Dr. 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  I show this to him pretty specifically.  In point of fact the L3 lateral herniation has shrunk over the years.  He has no central stenosis and we did go over the anatomy quite a bit with him.  Overall his spine looks fairly well except for these lateral disc herniations.  He has had physical therapy without much relief.  He is really frustrated at this point and rates his pain as an 8 out of 10.  It does affect his daily living quite significantly.  Again no radiating symptoms past the knee but into the buttocks.  No relief with current medications.  He does use heat and ice.  Review of Systems  Constitutional: Negative for chills, fever, malaise/fatigue and weight loss.  HENT: Negative for hearing loss and sinus pain.   Eyes: Negative for blurred vision, double vision and photophobia.  Respiratory: Negative for cough and shortness of breath.   Cardiovascular: Negative for chest pain, palpitations and leg swelling.  Gastrointestinal: Negative for abdominal pain, nausea and vomiting.  Genitourinary: Negative for flank pain.  Musculoskeletal: Positive for back pain. Negative for myalgias.  Skin:  Negative for itching and rash.  Neurological: Negative for tremors, focal weakness and weakness.  Endo/Heme/Allergies: Negative.   Psychiatric/Behavioral: Negative for depression.  All other systems reviewed and are negative.  Otherwise per HPI.  Assessment & Plan: Visit Diagnoses:  1. Radiculopathy due to lumbar intervertebral disc disorder   2. 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  This is more foraminal extraforaminal but does seem to go along with his  clinical presentation.  He has worsening symptoms with standing and bending particularly to the right but some to the left.  He gets some relief if he sits down but if he sits and bends it is really significantly worse.  He has had conservative care with medication management and physical therapy and injection treatment.  We did update MRI which was gone over as noted in the history of present illness.  At this point my recommendation was repeat L4 transforaminal injection with really good imaging and try to do an injection as best we can there and just see how much relief he gets.  This would be diagnostic.  I explained the rationale to him.  I also explained that seeing a neurosurgeon or spine surgeon for evaluation would be beneficial from a second opinion standpoint and they could look and see and tell him exactly what they could or could not do from a surgical standpoint.  At first he did want to do that and we are making a referral to neurosurgery but then he decided he would rather see Dr. Durward Fortes to go over the MRI and his recommendations which I think is fair.  He does trust Dr. Durward Fortes quite a bit.  I did tell him that we are unable to make the actual appointment with Dr. Durward Fortes but he can call their office and I will send him a note.  Other avenues would be medication such as Lyrica or other nerve membrane stabilizing medication.    Meds & Orders: No orders of the defined types were placed in this encounter.  No orders of the defined types were placed in this encounter.   Follow-up: Return for Right L4 transforaminal epidural.   Procedures: No procedures performed  No notes on file   Clinical History: Lumbar spine MRI 04/08/2016  L3-4: Right foraminal disc protrusion on the prior study has regressed in the interim with improved right foraminal patency. Circumferential disc bulging and mild facet hypertrophy result in mild right greater than left neural foraminal stenosis  without evidence of L3 nerve impingement. There is borderline bilateral lateral recess stenosis. No significant overall spinal stenosis.   L4-5: Circumferential disc bulging, small right foraminal disc protrusion, and facet hypertrophy result in moderate right and mild left neural foraminal stenosis and borderline bilateral lateral recess stenosis, unchanged.   L5-S1: Left foraminal disc osteophyte complex and mild facet arthrosis result in mild left neural foraminal stenosis, unchanged. No spinal stenosis.   IMPRESSION: 1. Regression of L3-4 disc protrusion with improved right neural foraminal patency. 2. Unchanged disc and facet degeneration elsewhere including at L4-5 where there is moderate right neural foraminal stenosis.   He reports that he has never smoked. He has never used smokeless tobacco. No results for input(s): HGBA1C, LABURIC in the last 8760 hours.  Objective:  VS:  HT:5\' 9"  (175.3 cm)    WT:190 lb (86.2 kg)   BMI:28.05  BP:(!) 158/75   HR:(!) 55bpm   TEMP: ( )   RESP:  Physical Exam Vitals signs and nursing note reviewed.  Constitutional:      General: He is not in acute distress.    Appearance: Normal appearance. He is well-developed.  HENT:     Head: Normocephalic and atraumatic.  Eyes:     Conjunctiva/sclera: Conjunctivae normal.     Pupils: Pupils are equal, round, and reactive to light.  Neck:     Musculoskeletal: Normal range of motion and neck supple. No neck rigidity.  Cardiovascular:     Rate and Rhythm: Normal rate.     Pulses: Normal pulses.     Heart sounds: Normal heart sounds.  Pulmonary:     Effort: Pulmonary effort is normal. No respiratory distress.  Musculoskeletal:     Right lower leg: No edema.     Left lower leg: No edema.     Comments: Patient ambulates without a somewhat slow to rise to a full standing position.  He has pain with lateral bending to the right more than left.  He has a negative slump test good distal strength.   Skin:    General: Skin is warm and dry.     Findings: No erythema or rash.  Neurological:     General: No focal deficit present.     Mental Status: He is alert and oriented to person, place, and time.     Sensory: No sensory deficit.     Coordination: Coordination normal.     Gait: Gait normal.  Psychiatric:        Mood and Affect: Mood normal.        Behavior: Behavior normal.     Ortho Exam Imaging: No results found.  Past Medical/Family/Surgical/Social History: Medications & Allergies reviewed per EMR, new medications updated. Patient Active Problem List   Diagnosis Date Noted   Lumbar radiculopathy 11/28/2018   History reviewed. No pertinent past medical history. History reviewed. No pertinent family history. History reviewed. No pertinent surgical history. Social History   Occupational History   Not on file  Tobacco Use   Smoking status: Never Smoker   Smokeless tobacco: Never Used  Substance and Sexual Activity   Alcohol use: Yes    Alcohol/week: 1.0 standard drinks    Types: 1 Cans of beer per week   Drug use: No   Sexual activity: Not on file

## 2018-12-05 ENCOUNTER — Encounter: Payer: Self-pay | Admitting: Orthopaedic Surgery

## 2018-12-05 ENCOUNTER — Other Ambulatory Visit: Payer: Self-pay

## 2018-12-05 ENCOUNTER — Ambulatory Visit (INDEPENDENT_AMBULATORY_CARE_PROVIDER_SITE_OTHER): Payer: PPO | Admitting: Orthopaedic Surgery

## 2018-12-05 VITALS — BP 154/74 | HR 70 | Ht 69.0 in | Wt 190.0 lb

## 2018-12-05 DIAGNOSIS — M5416 Radiculopathy, lumbar region: Secondary | ICD-10-CM

## 2018-12-05 NOTE — Progress Notes (Signed)
Office Visit Note   Patient: Wesley Jackson           Date of Birth: [birth year]           MRN: [id number] Visit Date: 12/05/2018              Requested by: Angelina Sheriff, MD [address],  [place] [address] PCP: Angelina Sheriff, MD   Assessment & Plan: Visit Diagnoses:  1. Lumbar radiculopathy     Plan: Mr. Perfect is had a problem with his lumbar spine for well over 7 to 8 years.  He has had MRI scan in 2017 with areas of degenerative change and some disc bulging and has had follow-up injections by Dr. Ernestina Patches.  He has had recent epidural steroid injection and facet injection at L3 without much relief.  He is not having any leg pain.  Dr. Ernestina Patches repeated the MRI scan of his lumbar spine last week demonstrating mild to moderate multilevel disc disease and facet disease that was unchanged from the scan of 2017.  There was no significant spinal stenosis.  He had a stable right foraminal and extraforaminal disc protrusion at L4-5 with mild displacement of the right L4 nerve root and stable mild foraminal and extraforaminal encroachment on the left possibly affecting the L5 nerve root.  Again he is not having any leg pain he is having significant compromise of his activities.  He has had physical therapy, over-the-counter medicines and multiple years of recurrent pain.  He like to be seen by a spine surgeon and had mentioned Dr. Harl Bowie at [facility].  Will make the referral.  Discussed MRI scan and treatment options over 30 minutes 50% of the time in counseling  Follow-Up Instructions: Return Will refer to Dr. Harl Bowie at [facility].   Orders:  No orders of the defined types were placed in this encounter.  No orders of the defined types were placed in this encounter.     Procedures: No procedures performed   Clinical Data: No additional findings.   Subjective: Chief Complaint  Patient presents with  . Lower Back - Pain  Patient presents today his lower back. He last saw  Dr.Caitland Porchia in 2017. He has since been seeing Dr.Newton for injections. Patient states that the injections have not been helping. He is not taking anything for pain. He has pain with getting up after resting. He enjoys golfing, but states that it causes pain. He walks in the morning and states that he has pain in his right hip, but that is the only time his pain is elsewhere. He had an MRI two weeks ago.  He has had relief of his leg pain after the injections but continues to have recurrent back pain.  He has had physical therapy  HPI  Review of Systems   Objective: Vital Signs: BP (!) 154/74   Pulse 70   Ht 5\' 9"  (1.753 m)   Wt 190 lb (86.2 kg)   BMI 28.06 kg/m   Physical Exam Constitutional:      Appearance: He is well-developed.  Eyes:     Pupils: Pupils are equal, round, and reactive to light.  Pulmonary:     Effort: Pulmonary effort is normal.  Skin:    General: Skin is warm and dry.  Neurological:     Mental Status: He is alert and oriented to person, place, and time.  Psychiatric:        Behavior: Behavior normal.  Ortho Exam awake alert and oriented x3.  Comfortable sitting.  Did not have any pain with ambulation.  His back was a little bit stiff today he had some very mild percussible tenderness more on the right than the left.  No pain over the sacroiliac joints.  Painless range of motion both hips.  Straight leg raise was negative motor exam intact.  Feet were warm  Specialty Comments:  No specialty comments available.  Imaging: No results found.   PMFS History: Patient Active Problem List   Diagnosis Date Noted  . Lumbar radiculopathy 11/28/2018   History reviewed. No pertinent past medical history.  History reviewed. No pertinent family history.  History reviewed. No pertinent surgical history. Social History   Occupational History  . Not on file  Tobacco Use  . Smoking status: Never Smoker  . Smokeless tobacco: Never Used  Substance and Sexual  Activity  . Alcohol use: Yes    Alcohol/week: 1.0 standard drinks    Types: 1 Cans of beer per week  . Drug use: No  . Sexual activity: Not on file

## 2018-12-06 ENCOUNTER — Other Ambulatory Visit: Payer: Self-pay | Admitting: *Deleted

## 2018-12-06 DIAGNOSIS — M5416 Radiculopathy, lumbar region: Secondary | ICD-10-CM

## 2018-12-07 ENCOUNTER — Ambulatory Visit: Payer: Self-pay

## 2018-12-07 ENCOUNTER — Encounter: Payer: Self-pay | Admitting: Physical Medicine and Rehabilitation

## 2018-12-07 ENCOUNTER — Ambulatory Visit (INDEPENDENT_AMBULATORY_CARE_PROVIDER_SITE_OTHER): Payer: PPO | Admitting: Physical Medicine and Rehabilitation

## 2018-12-07 VITALS — BP 147/78 | HR 64

## 2018-12-07 DIAGNOSIS — M5416 Radiculopathy, lumbar region: Secondary | ICD-10-CM

## 2018-12-07 MED ORDER — BETAMETHASONE SOD PHOS & ACET 6 (3-3) MG/ML IJ SUSP
12.0000 mg | Freq: Once | INTRAMUSCULAR | Status: AC
  Administered 2018-12-07: 14:00:00 12 mg

## 2018-12-07 NOTE — Procedures (Signed)
Lumbosacral Transforaminal Epidural Steroid Injection - Sub-Pedicular Approach with Fluoroscopic Guidance  Patient: Wesley Jackson      Date of Birth: Jun 01, 1940 MRN: [id number] PCP: Angelina Sheriff, MD      Visit Date: 12/07/2018   Universal Protocol:    Date/Time: 12/07/2018  Consent Given By: the patient  Position: PRONE  Additional Comments: Vital signs were monitored before and after the procedure. Patient was prepped and draped in the usual sterile fashion. The correct patient, procedure, and site was verified.   Injection Procedure Details:  Procedure Site One Meds Administered:  Meds ordered this encounter  Medications  . betamethasone acetate-betamethasone sodium phosphate (CELESTONE) injection 12 mg    Laterality: Right  Location/Site:  L4-L5  Needle size: 22 G  Needle type: Spinal  Needle Placement: Transforaminal  Findings:    -Comments: Excellent flow of contrast along the nerve and into the epidural space.  Procedure Details: After squaring off the end-plates to get a true AP view, the C-arm was positioned so that an oblique view of the foramen as noted above was visualized. The target area is just inferior to the "nose of the scotty dog" or sub pedicular. The soft tissues overlying this structure were infiltrated with 2-3 ml. of 1% Lidocaine without Epinephrine.  The spinal needle was inserted toward the target using a "trajectory" view along the fluoroscope beam.  Under AP and lateral visualization, the needle was advanced so it did not puncture dura and was located close the 6 O'Clock position of the pedical in AP tracterory. Biplanar projections were used to confirm position. Aspiration was confirmed to be negative for CSF and/or blood. A 1-2 ml. volume of Isovue-250 was injected and flow of contrast was noted at each level. Radiographs were obtained for documentation purposes.   After attaining the desired flow of contrast documented above, a 0.5  to 1.0 ml test dose of 0.25% Marcaine was injected into each respective transforaminal space.  The patient was observed for 90 seconds post injection.  After no sensory deficits were reported, and normal lower extremity motor function was noted,   the above injectate was administered so that equal amounts of the injectate were placed at each foramen (level) into the transforaminal epidural space.   Additional Comments:  The patient tolerated the procedure well Dressing: 2 x 2 sterile gauze and Band-Aid    Post-procedure details: Patient was observed during the procedure. Post-procedure instructions were reviewed.  Patient left the clinic in stable condition.

## 2018-12-07 NOTE — Progress Notes (Signed)
.  Numeric Pain Rating Scale and Functional Assessment Average Pain 6   In the last MONTH (on 0-10 scale) has pain interfered with the following?  1. General activity like being  able to carry out your everyday physical activities such as walking, climbing stairs, carrying groceries, or moving a chair?  Rating(5)   +Driver, -BT, -Dye Allergies.  

## 2018-12-11 DIAGNOSIS — Z6828 Body mass index (BMI) 28.0-28.9, adult: Secondary | ICD-10-CM | POA: Diagnosis not present

## 2018-12-11 DIAGNOSIS — R600 Localized edema: Secondary | ICD-10-CM | POA: Diagnosis not present

## 2018-12-11 DIAGNOSIS — Z1331 Encounter for screening for depression: Secondary | ICD-10-CM | POA: Diagnosis not present

## 2018-12-11 DIAGNOSIS — M519 Unspecified thoracic, thoracolumbar and lumbosacral intervertebral disc disorder: Secondary | ICD-10-CM | POA: Diagnosis not present

## 2018-12-11 DIAGNOSIS — E538 Deficiency of other specified B group vitamins: Secondary | ICD-10-CM | POA: Diagnosis not present

## 2018-12-11 DIAGNOSIS — Z9181 History of falling: Secondary | ICD-10-CM | POA: Diagnosis not present

## 2018-12-11 DIAGNOSIS — Z13220 Encounter for screening for lipoid disorders: Secondary | ICD-10-CM | POA: Diagnosis not present

## 2019-01-12 ENCOUNTER — Telehealth: Payer: Self-pay | Admitting: Radiology

## 2019-01-12 NOTE — Telephone Encounter (Signed)
Patient called requesting cd of MRI for ans appt  With Dr. Harl Bowie next week, I called and lmom that they will need to get it from [place].

## 2019-01-23 DIAGNOSIS — M4727 Other spondylosis with radiculopathy, lumbosacral region: Secondary | ICD-10-CM | POA: Diagnosis not present

## 2019-01-23 DIAGNOSIS — M48062 Spinal stenosis, lumbar region with neurogenic claudication: Secondary | ICD-10-CM | POA: Diagnosis not present

## 2019-01-23 DIAGNOSIS — M5117 Intervertebral disc disorders with radiculopathy, lumbosacral region: Secondary | ICD-10-CM | POA: Diagnosis not present

## 2019-01-23 DIAGNOSIS — M4726 Other spondylosis with radiculopathy, lumbar region: Secondary | ICD-10-CM | POA: Diagnosis not present

## 2019-01-23 DIAGNOSIS — M5116 Intervertebral disc disorders with radiculopathy, lumbar region: Secondary | ICD-10-CM | POA: Diagnosis not present

## 2019-01-23 NOTE — Progress Notes (Signed)
JENNY REESER - 78 y.o. male MRN [id number]  Date of birth: January 12, 1941  Office Visit Note: Visit Date: 12/07/2018 PCP: Angelina Sheriff, MD Referred by: Angelina Sheriff, MD  Subjective: Chief Complaint  Patient presents with  . Lower Back - Pain   HPI:  KAIEN VADEN is a 78 y.o. male who comes in today For planned right L4 transforaminal epidural steroid injection.  Please see our prior evaluation and management note for further details and justification.  ROS Otherwise per HPI.  Assessment & Plan: Visit Diagnoses:  1. Lumbar radiculopathy     Plan: No additional findings.   Meds & Orders:  Meds ordered this encounter  Medications  . betamethasone acetate-betamethasone sodium phosphate (CELESTONE) injection 12 mg    Orders Placed This Encounter  Procedures  . XR C-ARM NO REPORT  . Epidural Steroid injection    Follow-up: Return if symptoms worsen or fail to improve.   Procedures: No procedures performed  Lumbosacral Transforaminal Epidural Steroid Injection - Sub-Pedicular Approach with Fluoroscopic Guidance  Patient: JOENATHAN BONAVENTURA      Date of Birth: 1941/02/22 MRN: [id number] PCP: Angelina Sheriff, MD      Visit Date: 12/07/2018   Universal Protocol:    Date/Time: 12/07/2018  Consent Given By: the patient  Position: PRONE  Additional Comments: Vital signs were monitored before and after the procedure. Patient was prepped and draped in the usual sterile fashion. The correct patient, procedure, and site was verified.   Injection Procedure Details:  Procedure Site One Meds Administered:  Meds ordered this encounter  Medications  . betamethasone acetate-betamethasone sodium phosphate (CELESTONE) injection 12 mg    Laterality: Right  Location/Site:  L4-L5  Needle size: 22 G  Needle type: Spinal  Needle Placement: Transforaminal  Findings:    -Comments: Excellent flow of contrast along the nerve and into the epidural space.   Procedure Details: After squaring off the end-plates to get a true AP view, the C-arm was positioned so that an oblique view of the foramen as noted above was visualized. The target area is just inferior to the "nose of the scotty dog" or sub pedicular. The soft tissues overlying this structure were infiltrated with 2-3 ml. of 1% Lidocaine without Epinephrine.  The spinal needle was inserted toward the target using a "trajectory" view along the fluoroscope beam.  Under AP and lateral visualization, the needle was advanced so it did not puncture dura and was located close the 6 O'Clock position of the pedical in AP tracterory. Biplanar projections were used to confirm position. Aspiration was confirmed to be negative for CSF and/or blood. A 1-2 ml. volume of Isovue-250 was injected and flow of contrast was noted at each level. Radiographs were obtained for documentation purposes.   After attaining the desired flow of contrast documented above, a 0.5 to 1.0 ml test dose of 0.25% Marcaine was injected into each respective transforaminal space.  The patient was observed for 90 seconds post injection.  After no sensory deficits were reported, and normal lower extremity motor function was noted,   the above injectate was administered so that equal amounts of the injectate were placed at each foramen (level) into the transforaminal epidural space.   Additional Comments:  The patient tolerated the procedure well Dressing: 2 x 2 sterile gauze and Band-Aid    Post-procedure details: Patient was observed during the procedure. Post-procedure instructions were reviewed.  Patient left the clinic in stable condition.  Clinical History: Lumbar spine MRI 04/08/2016  L3-4: Right foraminal disc protrusion on the prior study has regressed in the interim with improved right foraminal patency. Circumferential disc bulging and mild facet hypertrophy result in mild right greater than left neural foraminal  stenosis without evidence of L3 nerve impingement. There is borderline bilateral lateral recess stenosis. No significant overall spinal stenosis.   L4-5: Circumferential disc bulging, small right foraminal disc protrusion, and facet hypertrophy result in moderate right and mild left neural foraminal stenosis and borderline bilateral lateral recess stenosis, unchanged.   L5-S1: Left foraminal disc osteophyte complex and mild facet arthrosis result in mild left neural foraminal stenosis, unchanged. No spinal stenosis.   IMPRESSION: 1. Regression of L3-4 disc protrusion with improved right neural foraminal patency. 2. Unchanged disc and facet degeneration elsewhere including at L4-5 where there is moderate right neural foraminal stenosis.     Objective:  VS:  HT:    WT:   BMI:     BP:(!) 147/78  HR:64bpm  TEMP: ( )  RESP:  Physical Exam  Ortho Exam Imaging: No results found.

## 2019-02-01 DIAGNOSIS — J302 Other seasonal allergic rhinitis: Secondary | ICD-10-CM | POA: Diagnosis not present

## 2019-02-01 DIAGNOSIS — Z0000 Encounter for general adult medical examination without abnormal findings: Secondary | ICD-10-CM | POA: Diagnosis not present

## 2019-02-01 DIAGNOSIS — Z23 Encounter for immunization: Secondary | ICD-10-CM | POA: Diagnosis not present

## 2019-02-01 DIAGNOSIS — Z1331 Encounter for screening for depression: Secondary | ICD-10-CM | POA: Diagnosis not present

## 2019-02-01 DIAGNOSIS — Z6828 Body mass index (BMI) 28.0-28.9, adult: Secondary | ICD-10-CM | POA: Diagnosis not present

## 2019-02-01 DIAGNOSIS — N400 Enlarged prostate without lower urinary tract symptoms: Secondary | ICD-10-CM | POA: Diagnosis not present

## 2019-02-01 DIAGNOSIS — K219 Gastro-esophageal reflux disease without esophagitis: Secondary | ICD-10-CM | POA: Diagnosis not present

## 2019-02-01 DIAGNOSIS — I10 Essential (primary) hypertension: Secondary | ICD-10-CM | POA: Diagnosis not present

## 2019-02-01 DIAGNOSIS — M519 Unspecified thoracic, thoracolumbar and lumbosacral intervertebral disc disorder: Secondary | ICD-10-CM | POA: Diagnosis not present

## 2019-02-21 DIAGNOSIS — M5126 Other intervertebral disc displacement, lumbar region: Secondary | ICD-10-CM | POA: Diagnosis not present

## 2019-02-21 DIAGNOSIS — R109 Unspecified abdominal pain: Secondary | ICD-10-CM | POA: Diagnosis not present

## 2019-02-23 DIAGNOSIS — L821 Other seborrheic keratosis: Secondary | ICD-10-CM | POA: Diagnosis not present

## 2019-02-23 DIAGNOSIS — C44319 Basal cell carcinoma of skin of other parts of face: Secondary | ICD-10-CM | POA: Diagnosis not present

## 2019-02-23 DIAGNOSIS — L570 Actinic keratosis: Secondary | ICD-10-CM | POA: Diagnosis not present

## 2019-02-23 DIAGNOSIS — L578 Other skin changes due to chronic exposure to nonionizing radiation: Secondary | ICD-10-CM | POA: Diagnosis not present

## 2019-02-23 DIAGNOSIS — M48062 Spinal stenosis, lumbar region with neurogenic claudication: Secondary | ICD-10-CM | POA: Diagnosis not present

## 2019-03-09 DIAGNOSIS — M48062 Spinal stenosis, lumbar region with neurogenic claudication: Secondary | ICD-10-CM | POA: Diagnosis not present

## 2019-04-05 DIAGNOSIS — M519 Unspecified thoracic, thoracolumbar and lumbosacral intervertebral disc disorder: Secondary | ICD-10-CM | POA: Diagnosis not present

## 2019-04-05 DIAGNOSIS — L259 Unspecified contact dermatitis, unspecified cause: Secondary | ICD-10-CM | POA: Diagnosis not present

## 2019-04-05 DIAGNOSIS — N400 Enlarged prostate without lower urinary tract symptoms: Secondary | ICD-10-CM | POA: Diagnosis not present

## 2019-04-05 DIAGNOSIS — D510 Vitamin B12 deficiency anemia due to intrinsic factor deficiency: Secondary | ICD-10-CM | POA: Diagnosis not present

## 2019-04-09 DIAGNOSIS — L259 Unspecified contact dermatitis, unspecified cause: Secondary | ICD-10-CM | POA: Diagnosis not present

## 2019-04-09 DIAGNOSIS — Z6828 Body mass index (BMI) 28.0-28.9, adult: Secondary | ICD-10-CM | POA: Diagnosis not present

## 2019-04-09 DIAGNOSIS — I10 Essential (primary) hypertension: Secondary | ICD-10-CM | POA: Diagnosis not present

## 2019-04-24 DIAGNOSIS — Z6827 Body mass index (BMI) 27.0-27.9, adult: Secondary | ICD-10-CM | POA: Diagnosis not present

## 2019-04-24 DIAGNOSIS — M519 Unspecified thoracic, thoracolumbar and lumbosacral intervertebral disc disorder: Secondary | ICD-10-CM | POA: Diagnosis not present

## 2019-04-24 DIAGNOSIS — I10 Essential (primary) hypertension: Secondary | ICD-10-CM | POA: Diagnosis not present

## 2019-04-24 DIAGNOSIS — B370 Candidal stomatitis: Secondary | ICD-10-CM | POA: Diagnosis not present

## 2019-04-27 DIAGNOSIS — C61 Malignant neoplasm of prostate: Secondary | ICD-10-CM

## 2019-04-27 HISTORY — PX: PROSTATE BIOPSY: SHX241

## 2019-04-27 HISTORY — DX: Malignant neoplasm of prostate: C61

## 2019-05-17 DIAGNOSIS — R351 Nocturia: Secondary | ICD-10-CM | POA: Diagnosis not present

## 2019-05-17 DIAGNOSIS — N401 Enlarged prostate with lower urinary tract symptoms: Secondary | ICD-10-CM | POA: Diagnosis not present

## 2019-05-24 DIAGNOSIS — D510 Vitamin B12 deficiency anemia due to intrinsic factor deficiency: Secondary | ICD-10-CM | POA: Diagnosis not present

## 2019-05-28 HISTORY — PX: NASAL SINUS SURGERY: SHX719

## 2019-05-28 HISTORY — PX: NASAL TURBINATE REDUCTION: SHX2072

## 2019-06-22 DIAGNOSIS — L300 Nummular dermatitis: Secondary | ICD-10-CM | POA: Diagnosis not present

## 2019-06-22 DIAGNOSIS — L570 Actinic keratosis: Secondary | ICD-10-CM | POA: Diagnosis not present

## 2019-06-22 DIAGNOSIS — L821 Other seborrheic keratosis: Secondary | ICD-10-CM | POA: Diagnosis not present

## 2019-06-22 DIAGNOSIS — L578 Other skin changes due to chronic exposure to nonionizing radiation: Secondary | ICD-10-CM | POA: Diagnosis not present

## 2019-06-25 DIAGNOSIS — D510 Vitamin B12 deficiency anemia due to intrinsic factor deficiency: Secondary | ICD-10-CM | POA: Diagnosis not present

## 2019-07-05 DIAGNOSIS — J343 Hypertrophy of nasal turbinates: Secondary | ICD-10-CM | POA: Diagnosis not present

## 2019-07-09 DIAGNOSIS — R351 Nocturia: Secondary | ICD-10-CM | POA: Diagnosis not present

## 2019-07-09 DIAGNOSIS — R972 Elevated prostate specific antigen [PSA]: Secondary | ICD-10-CM | POA: Diagnosis not present

## 2019-07-09 DIAGNOSIS — N401 Enlarged prostate with lower urinary tract symptoms: Secondary | ICD-10-CM | POA: Diagnosis not present

## 2019-07-18 DIAGNOSIS — J343 Hypertrophy of nasal turbinates: Secondary | ICD-10-CM | POA: Diagnosis not present

## 2019-07-18 DIAGNOSIS — Z20822 Contact with and (suspected) exposure to covid-19: Secondary | ICD-10-CM | POA: Diagnosis not present

## 2019-07-18 DIAGNOSIS — Z01812 Encounter for preprocedural laboratory examination: Secondary | ICD-10-CM | POA: Diagnosis not present

## 2019-07-24 DIAGNOSIS — K219 Gastro-esophageal reflux disease without esophagitis: Secondary | ICD-10-CM | POA: Diagnosis not present

## 2019-07-24 DIAGNOSIS — J343 Hypertrophy of nasal turbinates: Secondary | ICD-10-CM | POA: Diagnosis not present

## 2019-07-31 DIAGNOSIS — N401 Enlarged prostate with lower urinary tract symptoms: Secondary | ICD-10-CM | POA: Diagnosis not present

## 2019-07-31 DIAGNOSIS — R972 Elevated prostate specific antigen [PSA]: Secondary | ICD-10-CM | POA: Diagnosis not present

## 2019-07-31 DIAGNOSIS — C61 Malignant neoplasm of prostate: Secondary | ICD-10-CM | POA: Diagnosis not present

## 2019-08-03 DIAGNOSIS — D510 Vitamin B12 deficiency anemia due to intrinsic factor deficiency: Secondary | ICD-10-CM | POA: Diagnosis not present

## 2019-08-07 DIAGNOSIS — C61 Malignant neoplasm of prostate: Secondary | ICD-10-CM | POA: Diagnosis not present

## 2019-08-15 DIAGNOSIS — N200 Calculus of kidney: Secondary | ICD-10-CM | POA: Diagnosis not present

## 2019-08-15 DIAGNOSIS — C61 Malignant neoplasm of prostate: Secondary | ICD-10-CM | POA: Diagnosis not present

## 2019-08-25 HISTORY — PX: OTHER SURGICAL HISTORY: SHX169

## 2019-08-27 DIAGNOSIS — C61 Malignant neoplasm of prostate: Secondary | ICD-10-CM | POA: Diagnosis not present

## 2019-08-31 ENCOUNTER — Ambulatory Visit
Admission: RE | Admit: 2019-08-31 | Discharge: 2019-08-31 | Disposition: A | Discharge status: Home / Self Care | Payer: Self-pay | Source: Ambulatory Visit | Attending: Radiation Oncology | Admitting: Radiation Oncology

## 2019-08-31 ENCOUNTER — Other Ambulatory Visit: Payer: Self-pay | Admitting: Radiation Oncology

## 2019-08-31 DIAGNOSIS — C61 Malignant neoplasm of prostate: Secondary | ICD-10-CM

## 2019-09-04 ENCOUNTER — Telehealth: Payer: Self-pay | Admitting: Radiation Oncology

## 2019-09-04 NOTE — Telephone Encounter (Signed)
Confirmed 09/18/19 consult appointment with Dr. Tammi Klippel via telephone. Explained the nurse evaluation will begin at 1030 followed by the consult with Dr. Tammi Klippel at 1100. Patient's wife, [name], verbalized understanding.

## 2019-09-17 ENCOUNTER — Telehealth: Payer: Self-pay | Admitting: Radiation Oncology

## 2019-09-17 ENCOUNTER — Encounter: Payer: Self-pay | Admitting: Radiation Oncology

## 2019-09-17 DIAGNOSIS — Z20828 Contact with and (suspected) exposure to other viral communicable diseases: Secondary | ICD-10-CM | POA: Diagnosis not present

## 2019-09-17 DIAGNOSIS — J40 Bronchitis, not specified as acute or chronic: Secondary | ICD-10-CM | POA: Diagnosis not present

## 2019-09-17 DIAGNOSIS — Z8546 Personal history of malignant neoplasm of prostate: Secondary | ICD-10-CM | POA: Diagnosis not present

## 2019-09-17 DIAGNOSIS — J329 Chronic sinusitis, unspecified: Secondary | ICD-10-CM | POA: Diagnosis not present

## 2019-09-17 NOTE — Progress Notes (Signed)
GU Location of Tumor / Histology: prostatic adenocarcinoma  If Prostate Cancer, Gleason Score is (3 + 4) and PSA is (5.1)    Wesley Jackson reports he has not done a good job of keeping a check on his PSA since age 79. He explains he presented to Dr. Nila Nephew approximately 2 months ago to evaluate urinary frequency and nocturia.   Biopsies of prostate (if applicable) revealed:   Past/Anticipated interventions by urology, if any: Cialis prescribed, prostate biopsy, CT abd/pelvis (negative),  referral to Dr. Tammi Klippel for consideration of seed implant.  Past/Anticipated interventions by medical oncology, if any: no  Weight changes, if any: denies  Bowel/Bladder complaints, if any: IPSS 7. SHIM 20. Denies dysuria, hematuria, urinary leakage or incontinence. Reports nocturia x 0.1. Reports urinary frequency and hesitancy. Denies any bowel complaints. Endorses taking cialis and flomax. Reports improvement in urine stream with meds.   Nausea/Vomiting, if any: no  Pain issues, if any:  Denies   SAFETY ISSUES:  Prior radiation? denies  Pacemaker/ICD? denies  Possible current pregnancy? no, male patient  Is the patient on methotrexate? denies  Current Complaints / other details:  79 year old male. Married. Retired. Presently taking antibiotics for a sinus infection.

## 2019-09-18 ENCOUNTER — Other Ambulatory Visit: Payer: Self-pay

## 2019-09-18 ENCOUNTER — Encounter: Payer: Self-pay | Admitting: Radiation Oncology

## 2019-09-18 ENCOUNTER — Ambulatory Visit
Admission: RE | Admit: 2019-09-18 | Discharge: 2019-09-18 | Disposition: A | Discharge status: Home / Self Care | Payer: PPO | Source: Ambulatory Visit | Attending: Radiation Oncology | Admitting: Radiation Oncology

## 2019-09-18 VITALS — Ht 69.0 in | Wt 186.0 lb

## 2019-09-18 DIAGNOSIS — R972 Elevated prostate specific antigen [PSA]: Secondary | ICD-10-CM | POA: Diagnosis not present

## 2019-09-18 DIAGNOSIS — C61 Malignant neoplasm of prostate: Secondary | ICD-10-CM

## 2019-09-18 DIAGNOSIS — Z79899 Other long term (current) drug therapy: Secondary | ICD-10-CM | POA: Diagnosis not present

## 2019-09-18 DIAGNOSIS — R3989 Other symptoms and signs involving the genitourinary system: Secondary | ICD-10-CM | POA: Diagnosis not present

## 2019-09-18 NOTE — Progress Notes (Signed)
See progress notes under physician encounter. 

## 2019-09-18 NOTE — Progress Notes (Signed)
Radiation Oncology         [phone] ________________________________  Initial Outpatient Consultation - Conducted via Telephone due to current COVID-19 concerns for limiting patient exposure  Name: Wesley Jackson MRN: [id number]  Date: 09/18/2019  DOB: 26-Aug-1940  [id number], Wesley Blonder, MD  Joie Bimler, MD   REFERRING PHYSICIAN: Joie Bimler, MD  DIAGNOSIS: 79 y.o. gentleman with Stage T1c adenocarcinoma of the prostate with Gleason score of 3+4, and PSA of 5.1.    ICD-10-CM   1. Malignant neoplasm of prostate ([place])  C61     HISTORY OF PRESENT ILLNESS: Wesley Jackson is a 79 y.o. male with a diagnosis of prostate cancer. He was referred for evaluation in urology by Dr. Nila Nephew on 05/17/19 for BPH with increased LUTS,  digital rectal examination was performed at that time revealing no nodules. He had been on tamsulosin without significant benefit, and was changed to cialis at that time, which has improved his weak stream ad nocturia. A PSA was performed on 07/09/19, prior to his follow up visit and was noted elevated at 5.1. In reviewing previous records from his PCP, a prior PSA in 08/2015 was elevated at 5.4. The patient proceeded to transrectal ultrasound with 12 biopsies of the prostate on 07/31/2019.  The prostate volume measured 55.6 cc.  Out of 12 core biopsies, 5 were positive, all on the right.  The maximum Gleason score was 3+4, and this was seen in the right base lateral, right mid lateral, right apex lateral, right mid gland and right apex.Marland Kitchen  He underwent CT A/P on 08/13/2019 showing no findings of metastatic disease in the abdomen or pelvis.   The patient reviewed the biopsy results with his urologist and he has kindly been referred today for discussion of potential radiation treatment options.   PREVIOUS RADIATION THERAPY: No  PAST MEDICAL HISTORY:  Past Medical History:  Diagnosis Date  . Prostate cancer ([place])       PAST SURGICAL HISTORY: Past Surgical History:    Procedure Laterality Date  . PROSTATE BIOPSY      FAMILY HISTORY:  Family History  Problem Relation Age of Onset  . Prostate cancer Neg Hx   . Colon cancer Neg Hx   . Pancreatic cancer Neg Hx   . Breast cancer Neg Hx     SOCIAL HISTORY:  Social History   Socioeconomic History  . Marital status: Married    Spouse name: Not on file  . Number of children: Not on file  . Years of education: Not on file  . Highest education level: Not on file  Occupational History  . Not on file  Tobacco Use  . Smoking status: Never Smoker  . Smokeless tobacco: Never Used  Substance and Sexual Activity  . Alcohol use: Yes    Alcohol/week: 1.0 standard drinks    Types: 1 Cans of beer per week  . Drug use: No  . Sexual activity: Not Currently  Other Topics Concern  . Not on file  Social History Narrative  . Not on file   Social Determinants of Health   Financial Resource Strain:   . Difficulty of Paying Living Expenses:   Food Insecurity:   . Worried About Charity fundraiser in the Last Year:   . Arboriculturist in the Last Year:   Transportation Needs:   . Film/video editor (Medical):   Marland Kitchen Lack of Transportation (Non-Medical):   Physical Activity:   . Days of Exercise  per Week:   . Minutes of Exercise per Session:   Stress:   . Feeling of Stress :   Social Connections:   . Frequency of Communication with Friends and Family:   . Frequency of Social Gatherings with Friends and Family:   . Attends Religious Services:   . Active Member of Clubs or Organizations:   . Attends Archivist Meetings:   Marland Kitchen Marital Status:   Intimate Partner Violence:   . Fear of Current or Ex-Partner:   . Emotionally Abused:   Marland Kitchen Physically Abused:   . Sexually Abused:     ALLERGIES: Patient has no known allergies.  MEDICATIONS:  Current Outpatient Medications  Medication Sig Dispense Refill  . acetaminophen (TYLENOL) 325 MG tablet Take by mouth.    Marland Kitchen amLODipine (NORVASC) 5 MG  tablet Take 5 mg by mouth daily.  5  . amoxicillin-clavulanate (AUGMENTIN) 400-57 MG/5ML suspension Take by mouth 2 (two) times daily.    Marland Kitchen augmented betamethasone dipropionate (DIPROLENE-AF) 0.05 % cream APPLY TOPICALLY TWICE A DAY AS NEEDED    . guaiFENesin-codeine 100-10 MG/5ML syrup Take 5-10 mLs by mouth every 4 (four) hours as needed.    . naproxen sodium (ALEVE) 220 MG tablet Take by mouth.    Marland Kitchen omeprazole (PRILOSEC) 20 MG capsule Take 20 mg by mouth every morning.  1  . tadalafil (CIALIS) 5 MG tablet Take 5 mg by mouth daily.    . tamsulosin (FLOMAX) 0.4 MG CAPS capsule TAKE 1 CAPSULE EVERY DAY AT BEDTIME    . triamcinolone cream (KENALOG) 0.1 % APPLY TO AFFECTED AREA 3 TIMES A DAY FOR 2 WEEKS THEN AS NEEDED     No current facility-administered medications for this encounter.    REVIEW OF SYSTEMS:  On review of systems, the patient reports that he is doing well overall. He denies any chest pain, shortness of breath, cough, fevers, chills, night sweats, unintended weight changes. He denies any bowel disturbances, and denies abdominal pain, nausea or vomiting. He denies any new musculoskeletal or joint aches or pains. His IPSS was 7, indicating mild to moderate urinary symptoms. He reports nocturia x0-1, urinary frequency, and hesitancy which are all improved on daily Cialis which he continues taking as prescribed. His SHIM was 20, indicating he has mild erectile dysfunction.. A complete review of systems is obtained and is otherwise negative.    PHYSICAL EXAM:  Wt Readings from Last 3 Encounters:  09/18/19 186 lb (84.4 kg)  12/05/18 190 lb (86.2 kg)  11/28/18 190 lb (86.2 kg)   Temp Readings from Last 3 Encounters:  06/27/18 97.6 F (36.4 C) (Oral)  05/01/18 97.9 F (36.6 C) (Oral)   BP Readings from Last 3 Encounters:  12/07/18 (!) 147/78  12/05/18 (!) 154/74  11/28/18 (!) 158/75   Pulse Readings from Last 3 Encounters:  12/07/18 64  12/05/18 70  11/28/18 (!) 55   Pain  Assessment Pain Score: 0-No pain/10  Physical exam not performed in light of telephone consult visit format.   KPS = 100  100 - Normal; no complaints; no evidence of disease. 90   - Able to carry on normal activity; minor signs or symptoms of disease. 80   - Normal activity with effort; some signs or symptoms of disease. 83   - Cares for self; unable to carry on normal activity or to do active work. 60   - Requires occasional assistance, but is able to care for most of his personal needs. 50   -  Requires considerable assistance and frequent medical care. 57   - Disabled; requires special care and assistance. 53   - Severely disabled; hospital admission is indicated although death not imminent. 42   - Very sick; hospital admission necessary; active supportive treatment necessary. 10   - Moribund; fatal processes progressing rapidly. 0     - Dead  Karnofsky DA, Abelmann WH, Craver LS and Burchenal JH [phone]) The use of the nitrogen mustards in the palliative treatment of carcinoma: with particular reference to bronchogenic carcinoma Cancer 1 [phone]  LABORATORY DATA:  No results found for: WBC, HGB, HCT, MCV, PLT No results found for: NA, K, CL, CO2 No results found for: ALT, AST, GGT, ALKPHOS, BILITOT   RADIOGRAPHY: No results found.    IMPRESSION/PLAN: This visit was conducted via Telephone to spare the patient unnecessary potential exposure in the healthcare setting during the current COVID-19 pandemic. 1. 79 y.o. gentleman with Stage T1c adenocarcinoma of the prostate with Gleason Score of 3+4, and PSA of 5.1. We discussed the patient's workup and outlined the nature of prostate cancer in this setting. The patient's T stage, Gleason's score, and PSA put him into the favorable intermediate risk group. Accordingly, he is eligible for a variety of potential treatment options including brachytherapy, 5.5 weeks of external radiation, or prostatectomy. We discussed the available radiation  techniques, and focused on the details and logistics of delivery. We discussed and outlined the risks, benefits, short and long-term effects associated with radiotherapy and compared and contrasted these with prostatectomy. We discussed the role of SpaceOAR in reducing the rectal toxicity associated with radiotherapy.  He was encouraged to ask questions that were answered to his stated satisfaction.  At the end of the conversation, the patient is interested in moving forward with brachytherapy and use of SpaceOAR to reduce rectal toxicity from radiotherapy.  We will share our discussion with Dr. Nila Nephew and move forward with scheduling his CT [facility] planning appointment in the near future.  The patient will be contacted by Romie Jumper in our office who will be working closely with him to coordinate OR scheduling and pre and post procedure appointments.  We will contact the pharmaceutical rep to ensure that Don[employer] Ana is available at the time of procedure.  He will have a prostate MRI following his post-seed CT SIM to confirm appropriate distribution of the Weed.   Given current concerns for patient exposure during the COVID-19 pandemic, this encounter was conducted via telephone. The patient was notified in advance and was offered a MyChart meeting to allow for face to face communication but unfortunately reported that he did not have the appropriate resources/technology to support such a visit and instead preferred to proceed with telephone consult. The patient has given verbal consent for this type of encounter. The time spent during this encounter was 45 minutes. The attendants for this meeting include Tyler Pita MD, Ashlyn Bruning PA-C, Owen, and patient, Wesley Jackson. During the encounter, Tyler Pita MD, Ashlyn Bruning PA-C, and scribe, Wilburn Mylar were located at [place].  Patient, Wesley Jackson was located at  home.    Nicholos Johns, PA-C    Tyler Pita, MD  [facility] Direct Dial: [phone]  Fax: [phone] [email or web address]  Skype  [email or web address]  This document serves as a record of services personally performed by Tyler Pita, MD and Freeman Caldron, PA-C. It was created on their behalf by Wilburn Mylar, a trained medical  scribe. The creation of this record is based on the scribe's personal observations and the provider's statements to them. This document has been checked and approved by the attending provider.

## 2019-09-20 ENCOUNTER — Telehealth: Payer: Self-pay | Admitting: *Deleted

## 2019-09-20 NOTE — Telephone Encounter (Signed)
Called patient to ask questions, spoke with patient 

## 2019-09-28 DIAGNOSIS — C44319 Basal cell carcinoma of skin of other parts of face: Secondary | ICD-10-CM | POA: Diagnosis not present

## 2019-09-28 DIAGNOSIS — L570 Actinic keratosis: Secondary | ICD-10-CM | POA: Diagnosis not present

## 2019-09-28 DIAGNOSIS — C44311 Basal cell carcinoma of skin of nose: Secondary | ICD-10-CM | POA: Diagnosis not present

## 2019-10-01 ENCOUNTER — Encounter: Payer: Self-pay | Admitting: Medical Oncology

## 2019-10-01 ENCOUNTER — Telehealth: Payer: Self-pay | Admitting: *Deleted

## 2019-10-01 DIAGNOSIS — D519 Vitamin B12 deficiency anemia, unspecified: Secondary | ICD-10-CM | POA: Diagnosis not present

## 2019-10-01 NOTE — Progress Notes (Signed)
Patient returned my call. I reintroduced myself as the prostate nurse navigator and discuss my role. I was unable to meet him 5/25, when he consulted with Dr.Manning. He has chosen brachytherapy as treatment. He is scheduled for CT simulation 7/8 @ 9 am but has not received a surgery date. I will follow up with Enid Derry to see if she has any information.  I discussed with the purpose of the CT simulation and where it will be done. All questions were answered. He has my contact information and I asked him to call me with questions or concerns. He voiced understanding.

## 2019-10-01 NOTE — Progress Notes (Signed)
Left message with patient to introduce myself as the prostate nurse navigator. I asked him to return my call so I can discuss my role and address any needs or barriers to care. He consulted with Dr. Tammi Klippel 5/25, and has chosen brachytherapy as treatment.

## 2019-10-01 NOTE — Telephone Encounter (Signed)
CALLED PATIENT TO UPDATE, SPOKE WITH PATIENT 

## 2019-10-02 ENCOUNTER — Telehealth: Payer: Self-pay | Admitting: *Deleted

## 2019-10-02 NOTE — Telephone Encounter (Signed)
Called patient to update, lvm for a return call 

## 2019-10-03 ENCOUNTER — Telehealth: Payer: Self-pay | Admitting: *Deleted

## 2019-10-03 NOTE — Telephone Encounter (Signed)
Called patient to inform of pre-seed appts. for 11-01-19 and his implant on 12-20-19, spoke with patient's wife- [name] and she is aware of these appts.

## 2019-10-08 DIAGNOSIS — C44319 Basal cell carcinoma of skin of other parts of face: Secondary | ICD-10-CM | POA: Diagnosis not present

## 2019-10-08 DIAGNOSIS — D0439 Carcinoma in situ of skin of other parts of face: Secondary | ICD-10-CM | POA: Diagnosis not present

## 2019-10-31 ENCOUNTER — Telehealth: Payer: Self-pay | Admitting: *Deleted

## 2019-10-31 DIAGNOSIS — D519 Vitamin B12 deficiency anemia, unspecified: Secondary | ICD-10-CM | POA: Diagnosis not present

## 2019-10-31 NOTE — Telephone Encounter (Signed)
Called patient to remind of pre-seed appts. for 11-01-19, spoke with patient's wife- [name] and she is aware of these appts.

## 2019-11-01 ENCOUNTER — Ambulatory Visit (HOSPITAL_COMMUNITY)
Admission: RE | Admit: 2019-11-01 | Discharge: 2019-11-01 | Disposition: A | Discharge status: Home / Self Care | Payer: PPO | Source: Ambulatory Visit | Attending: Urology | Admitting: Urology

## 2019-11-01 ENCOUNTER — Ambulatory Visit
Admission: RE | Admit: 2019-11-01 | Discharge: 2019-11-01 | Disposition: A | Discharge status: Home / Self Care | Payer: PPO | Source: Ambulatory Visit | Attending: Radiation Oncology | Admitting: Radiation Oncology

## 2019-11-01 ENCOUNTER — Other Ambulatory Visit: Payer: Self-pay

## 2019-11-01 ENCOUNTER — Encounter (HOSPITAL_COMMUNITY)
Admission: RE | Admit: 2019-11-01 | Discharge: 2019-11-01 | Disposition: A | Discharge status: Home / Self Care | Payer: PPO | Source: Ambulatory Visit | Attending: Urology | Admitting: Urology

## 2019-11-01 ENCOUNTER — Encounter: Payer: Self-pay | Admitting: Medical Oncology

## 2019-11-01 ENCOUNTER — Ambulatory Visit
Admission: RE | Admit: 2019-11-01 | Discharge: 2019-11-01 | Disposition: A | Discharge status: Home / Self Care | Payer: PPO | Source: Ambulatory Visit | Attending: Urology | Admitting: Urology

## 2019-11-01 DIAGNOSIS — C61 Malignant neoplasm of prostate: Secondary | ICD-10-CM | POA: Insufficient documentation

## 2019-11-01 DIAGNOSIS — Z01818 Encounter for other preprocedural examination: Secondary | ICD-10-CM | POA: Diagnosis not present

## 2019-11-01 DIAGNOSIS — J90 Pleural effusion, not elsewhere classified: Secondary | ICD-10-CM | POA: Diagnosis not present

## 2019-11-02 NOTE — Progress Notes (Signed)
  Radiation Oncology         [phone] ________________________________  Name: Wesley Jackson MRN: [id number]  Date: 11/01/2019  DOB: 1940-09-21  SIMULATION AND TREATMENT PLANNING NOTE PUBIC ARCH STUDY  [id number], Angelique Blonder, MD  Joie Bimler, MD  DIAGNOSIS: 79 y.o. gentleman with Stage T1c adenocarcinoma of the prostate with Gleason score of 3+4, and PSA of 5.1.   Oncology History  Malignant neoplasm of prostate ([place])  07/31/2019 Cancer Staging   Staging form: Prostate, AJCC 8th Edition - Clinical stage from 07/31/2019: Stage IIB (cT1c, cN0, cM0, PSA: 5.1, Grade Group: 2) - Signed by Freeman Caldron, PA-C on 09/18/2019   09/18/2019 Initial Diagnosis   Malignant neoplasm of prostate ([place])       ICD-10-CM   1. Malignant neoplasm of prostate ([place])  C61     COMPLEX SIMULATION:  The patient presented today for evaluation for possible prostate seed implant. He was brought to the radiation planning suite and placed supine on the CT couch. A 3-dimensional image study set was obtained in upload to the planning computer. There, on each axial slice, I contoured the prostate gland. Then, using three-dimensional radiation planning tools I reconstructed the prostate in view of the structures from the transperineal needle pathway to assess for possible pubic arch interference. In doing so, I did not appreciate any pubic arch interference. Also, the patient's prostate volume was estimated based on the drawn structure. The volume was 54  cc.  Given the pubic arch appearance and prostate volume, patient remains a good candidate to proceed with prostate seed implant. Today, he freely provided informed written consent to proceed.    PLAN: The patient will undergo prostate seed implant.   ________________________________  Sheral Apley. Tammi Klippel, M.D.

## 2019-11-10 IMAGING — MR MRI LUMBAR SPINE WITHOUT CONTRAST
5 series · 42 of 48 positions shown · non-contrast
Comparison: 04/08/2016

CLINICAL DATA: Low back and right hip and leg pain.

EXAM:
MRI LUMBAR SPINE WITHOUT CONTRAST
TECHNIQUE: Multiplanar, multisequence MR imaging of the lumbar spine was
performed. No intravenous contrast was administered.

[Series 3: T2 post-contrast · sagittal · 4.0mm · 0.88mm/px · 6 of 15 slices shown]
[im 1/15]
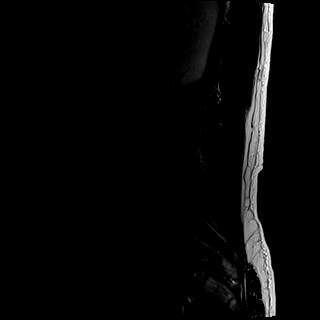
[im 3/15]
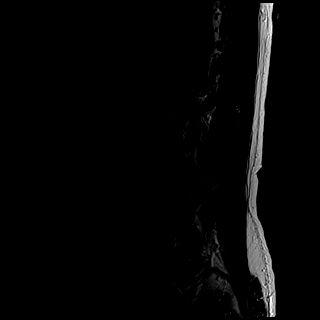
[im 6/15]
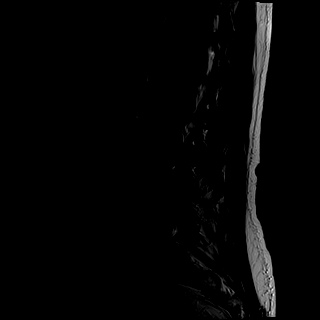
[im 9/15]
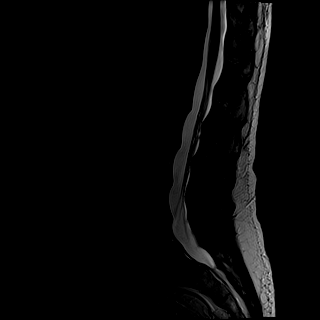
[im 12/15]
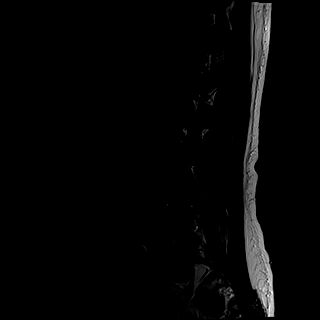
[im 15/15]
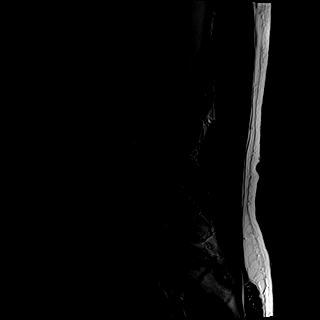

[Series 4: T1 · sagittal · 4.0mm · 0.88mm/px · 6 of 15 slices shown (1 of 2)]
[im 1/15]
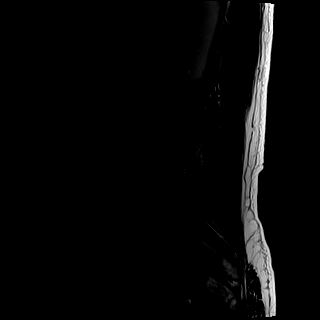
[im 3/15]
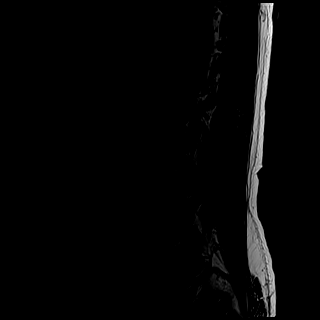
[im 6/15]
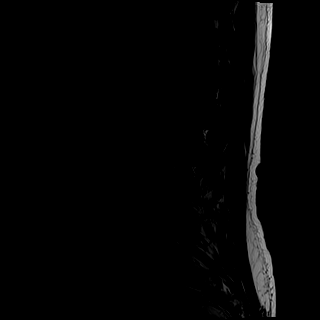
[im 9/15]
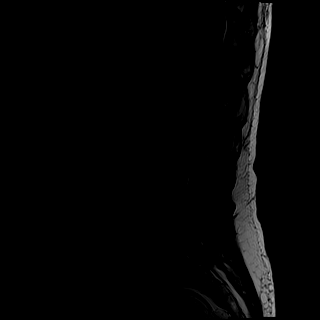
[im 12/15]
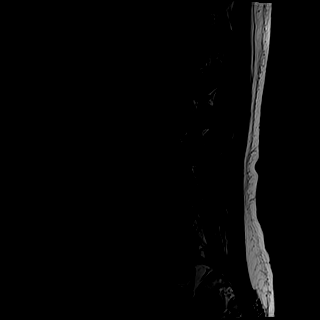
[im 15/15]
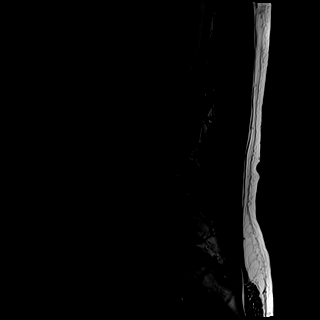

[Series 5: tirm sag · sagittal · 4.0mm · 0.55mm/px · 6 of 15 slices shown]
[im 1/15]
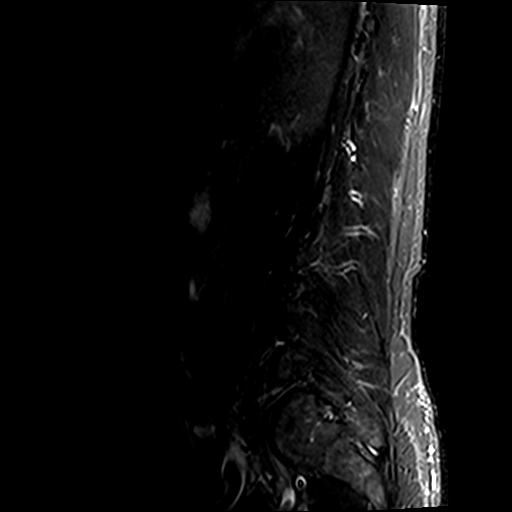
[im 3/15]
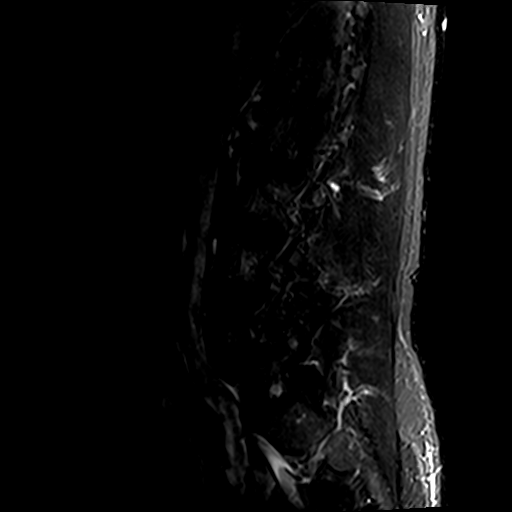
[im 6/15]
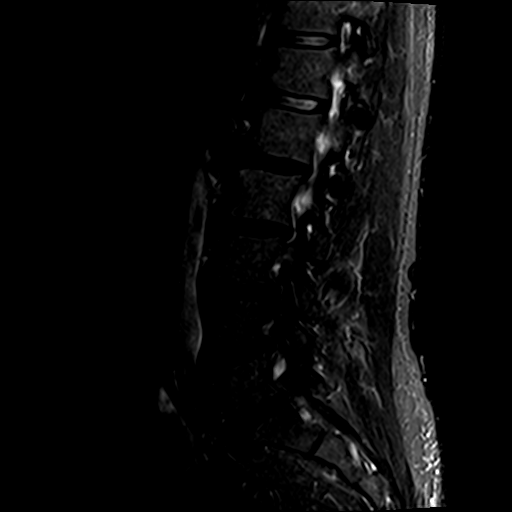
[im 9/15]
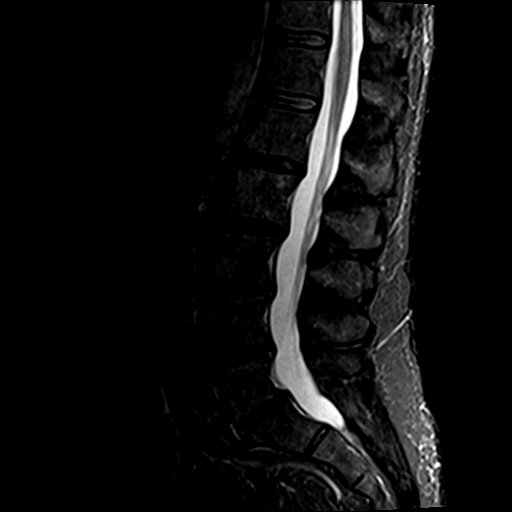
[im 12/15]
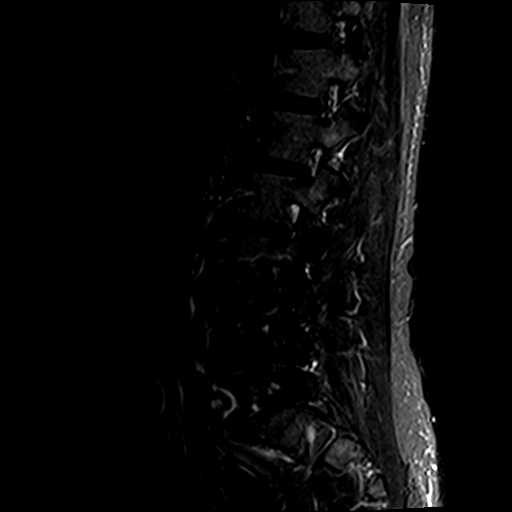
[im 15/15]
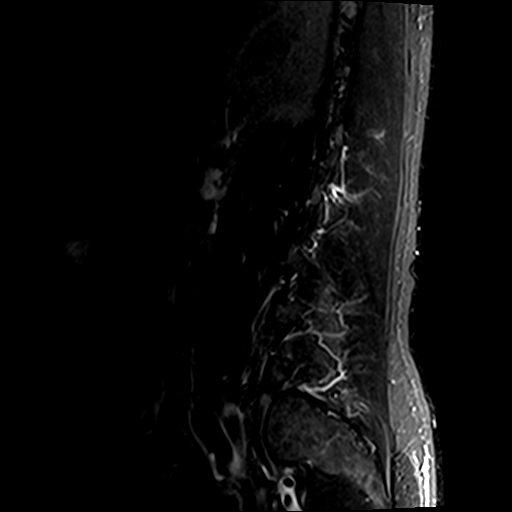

[Series 6: T1 · axial · 4.0mm · 0.78mm/px · z∈[-164,+52]mm · 9 of 38 slices shown (2 of 2)]
[im 1/38]
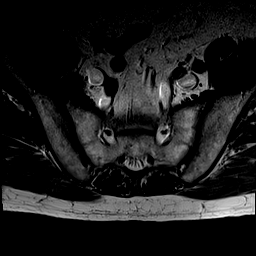
[im 6/38]
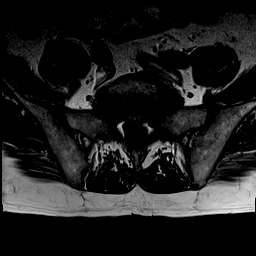
[im 11/38]
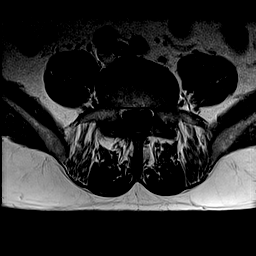
[im 16/38]
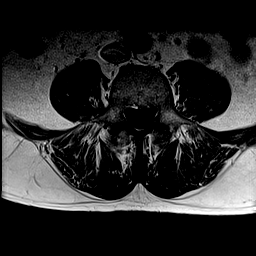
[im 19/38]
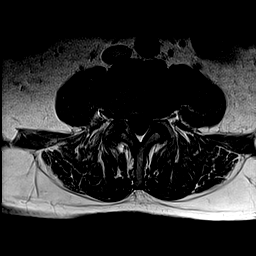
[im 22/38]
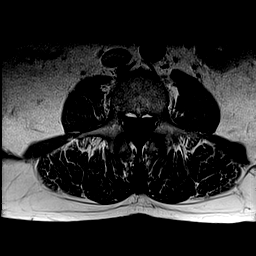
[im 27/38]
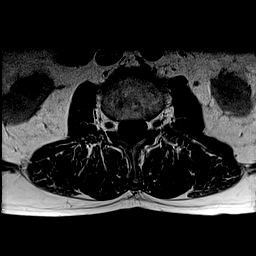
[im 32/38]
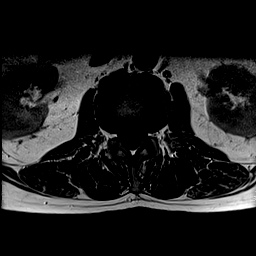
[im 38/38]
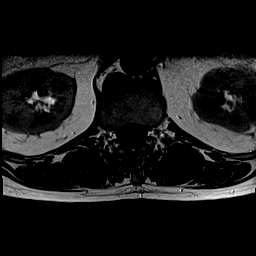

[Series 7: T2 · axial · 4.0mm · 0.78mm/px · z∈[-164,+52]mm · 15 of 38 slices shown]
[im 1/38]
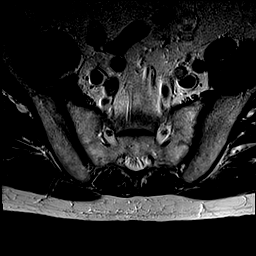
[im 3/38]
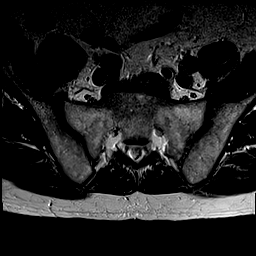
[im 6/38]
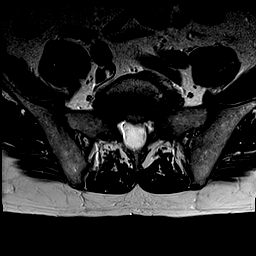
[im 8/38]
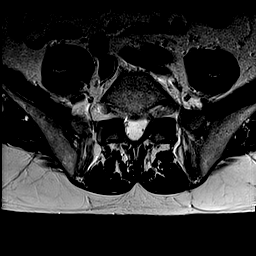
[im 11/38]
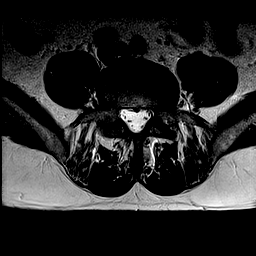
[im 14/38]
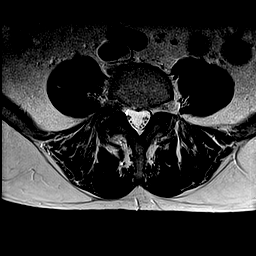
[im 16/38]
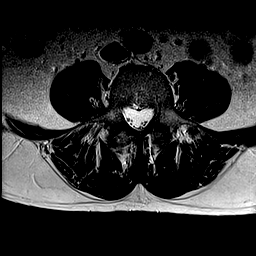
[im 19/38]
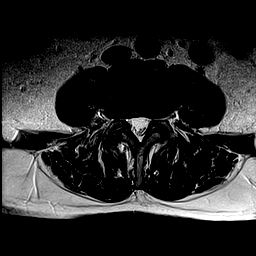
[im 22/38]
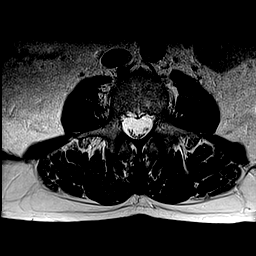
[im 24/38]
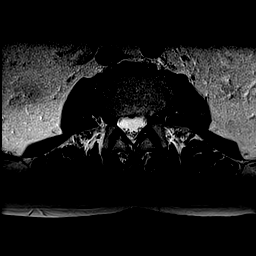
[im 27/38]
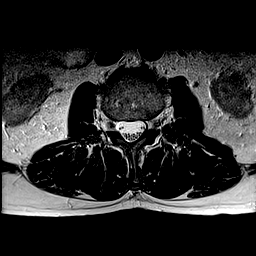
[im 30/38]
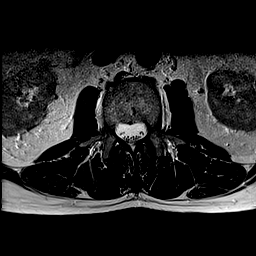
[im 32/38]
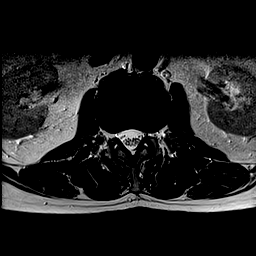
[im 35/38]
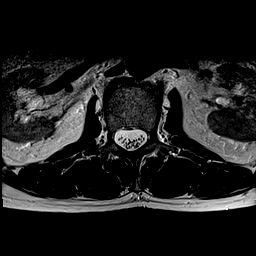
[im 38/38]
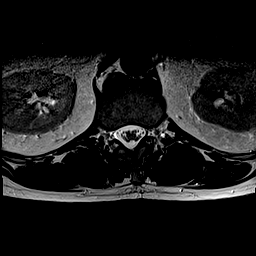

[42 of 48 positions shown; findings below may reference images not displayed]

FINDINGS: Segmentation: There are five lumbar type vertebral bodies. The last
full intervertebral disc space is labeled L5-S1. This correlates
with the prior MR examination.

Alignment:  Normal

Vertebrae:  Normal marrow signal.  No bone lesions or fractures

Conus medullaris and cauda equina: Conus extends to the T12-L1
level. Conus and cauda equina appear normal.

Paraspinal and other soft tissues: No significant paraspinal or
retroperitoneal findings.

Disc levels:

T12-L1: No significant findings.

L1-2: Mild disc desiccation and slight bulging annulus with mild
lateral recess encroachment but no spinal or foraminal stenosis.

L2-3: Slight bulging annulus and mild osteophytic ridging with mild
lateral recess encroachment but no spinal or foraminal stenosis.

L3-4: Bulging degenerated annulus and osteophytic ridging with
flattening of the ventral thecal sac and mild bilateral lateral
recess stenosis. There is also mild bilateral extraforaminal
encroachment on the L3 nerve roots. This appears stable.

L4-5: Bulging annulus and facet disease with mild to moderate
bilateral lateral recess stenosis. There is also a right foraminal
and extraforaminal disc protrusion slightly displacing the right L4
nerve root. This appears stable. No significant spinal stenosis.

L5-S1: Bulging degenerated annulus and moderate facet disease but
the spinal canal is quite generous and there is no significant
spinal or lateral recess stenosis. Mild left foraminal and
extraforaminal encroachment on the left L5 nerve root is stable.
IMPRESSION: 1. Mild-to-moderate multilevel disc disease and facet disease,
unchanged.
2. Mild multilevel lateral recess stenosis without significant
spinal stenosis.
3. Stable right foraminal and extraforaminal disc protrusion at L4-5
with mild displacement of the right L4 nerve root.
4. Stable mild foraminal and extraforaminal encroachment on the left
L5 nerve root at L5-S1.

## 2019-12-05 DIAGNOSIS — C61 Malignant neoplasm of prostate: Secondary | ICD-10-CM | POA: Diagnosis not present

## 2019-12-06 DIAGNOSIS — D510 Vitamin B12 deficiency anemia due to intrinsic factor deficiency: Secondary | ICD-10-CM | POA: Diagnosis not present

## 2019-12-12 ENCOUNTER — Telehealth: Payer: Self-pay | Admitting: *Deleted

## 2019-12-12 ENCOUNTER — Encounter (HOSPITAL_BASED_OUTPATIENT_CLINIC_OR_DEPARTMENT_OTHER): Payer: Self-pay | Admitting: Urology

## 2019-12-12 ENCOUNTER — Other Ambulatory Visit: Payer: Self-pay

## 2019-12-12 NOTE — Telephone Encounter (Signed)
Returned patient's phone call, lvm for a return call 

## 2019-12-12 NOTE — Telephone Encounter (Signed)
RETURNED PATIENT'S PHONE CALL, SPOKE WITH PATIENT'S WIFE 

## 2019-12-12 NOTE — Progress Notes (Signed)
Spoke w/ via phone for pre-op interview---PT Lab needs dos----NONE HAS LAB APPT 12-17-2019 AT 1300 PM FOR CBC WITH DIF, CMET, PT, PTT               Lab results------EKG AND CHEST XRAY BOTH DONE 11-01-2019 EPIC COVID test ------12-17-2019 AT 1400 PM Arrive at -------1200 PM 12-20-2019 NPO after MN NO Solid Food.  Clear liquids from MN until---1100 AM THEN NPO Medications to take morning of surgery -----AMLODIPINE, CIALIS, OMEPRAZOLE Diabetic medication -----N/A Patient Special Instructions -----FLEETS ENEMA AM OF SURGERY Pre-Op special Istructions -----NONE Patient verbalized understanding of instructions that were given at this phone interview. Patient denies shortness of breath, chest pain, fever, cough at this phone interview.

## 2019-12-17 ENCOUNTER — Other Ambulatory Visit (HOSPITAL_COMMUNITY)
Admission: RE | Admit: 2019-12-17 | Discharge: 2019-12-17 | Disposition: A | Discharge status: Home / Self Care | Payer: PPO | Source: Ambulatory Visit | Attending: Urology | Admitting: Urology

## 2019-12-17 ENCOUNTER — Other Ambulatory Visit: Payer: Self-pay

## 2019-12-17 ENCOUNTER — Encounter (HOSPITAL_COMMUNITY)
Admission: RE | Admit: 2019-12-17 | Discharge: 2019-12-17 | Disposition: A | Discharge status: Home / Self Care | Payer: PPO | Source: Ambulatory Visit | Attending: Urology | Admitting: Urology

## 2019-12-17 DIAGNOSIS — Z20822 Contact with and (suspected) exposure to covid-19: Secondary | ICD-10-CM | POA: Insufficient documentation

## 2019-12-17 DIAGNOSIS — Z01812 Encounter for preprocedural laboratory examination: Secondary | ICD-10-CM | POA: Insufficient documentation

## 2019-12-17 LAB — CBC
HCT: 43.3 % (ref 39.0–52.0)
Hemoglobin: 14.3 g/dL (ref 13.0–17.0)
MCH: 29.1 pg (ref 26.0–34.0)
MCHC: 33 g/dL (ref 30.0–36.0)
MCV: 88 fL (ref 80.0–100.0)
Platelets: 208 10*3/uL (ref 150–400)
RBC: 4.92 MIL/uL (ref 4.22–5.81)
RDW: 14 % (ref 11.5–15.5)
WBC: 5 10*3/uL (ref 4.0–10.5)
nRBC: 0 % (ref 0.0–0.2)

## 2019-12-17 LAB — COMPREHENSIVE METABOLIC PANEL
ALT: 23 U/L (ref 0–44)
AST: 23 U/L (ref 15–41)
Albumin: 4.3 g/dL (ref 3.5–5.0)
Alkaline Phosphatase: 89 U/L (ref 38–126)
Anion gap: 9 (ref 5–15)
BUN: 16 mg/dL (ref 8–23)
CO2: 27 mmol/L (ref 22–32)
Calcium: 9.4 mg/dL (ref 8.9–10.3)
Chloride: 105 mmol/L (ref 98–111)
Creatinine, Ser: 0.87 mg/dL (ref 0.61–1.24)
GFR calc Af Amer: >60 mL/min (ref >60)
GFR calc non Af Amer: >60 mL/min (ref >60)
Glucose, Bld: 124 mg/dL — ABNORMAL HIGH (ref 70–99)
Potassium: 4.2 mmol/L (ref 3.5–5.1)
Sodium: 141 mmol/L (ref 135–145)
Total Bilirubin: 0.6 mg/dL (ref 0.3–1.2)
Total Protein: 6.7 g/dL (ref 6.5–8.1)

## 2019-12-17 LAB — SARS CORONAVIRUS 2 (TAT 6-24 HRS): SARS Coronavirus 2: NEGATIVE

## 2019-12-17 LAB — APTT: aPTT: 28 seconds (ref 24–36)

## 2019-12-17 LAB — PROTIME-INR
INR: 1 (ref 0.8–1.2)
Prothrombin Time: 12.6 seconds (ref 11.4–15.2)

## 2019-12-18 NOTE — H&P (Signed)
NAME: Wesley Jackson, Wesley Jackson MEDICAL RECORD [id number] ACCOUNT [id number] DATE OF BIRTH:Feb 23, 1941 FACILITY: [facility] LOCATION: [facility] PHYSICIAN:Dulcie Gammon, MD  HISTORY AND PHYSICAL  DATE OF ADMISSION:  12/20/2019  HISTORY OF PRESENT ILLNESS:  The patient is a 79 year old white male with a recent diagnosis of prostate cancer.  He underwent transrectal ultrasound and prostate needle biopsy in April 2021.  The biopsy revealed a Gleason's 7 adenocarcinoma 3+4 in 5 of  6 biopsies on the right.  He has been voiding well.  His CT scan did not show any evidence of spread.  He has seen Dr. Tyler Pita and is interested in seed implant for definitive therapy of his prostate cancer.  PAST MEDICAL HISTORY:  High blood pressure, gastroesophageal reflux, BPH.  MEDICATIONS:  Daily Cialis, amlodipine 5 mg every day, olmesartan 20 mg, tamsulosin 0.4 mg, omeprazole 20 mg, and aspirin 81 mg.  FAMILY HISTORY:  Significant for coronary artery disease and diabetes.  PAST SURGICAL HISTORY:  Hernia repair in 2015.    His last PSA was 5.1.  SOCIAL HISTORY:  He does not smoke.  He has minimal alcohol use.  ALLERGIES:  No known drug allergies.  REVIEW OF SYSTEMS:  Negative for any shortness of breath, no chest pain.  Bowel movements are regular.  No hematuria.  No history of headaches or dizziness.  PHYSICAL EXAMINATION: GENERAL:  Well-developed, well-nourished man in no acute distress. VITAL SIGNS:  Stable. GENERAL APPEARANCE:  Shows a cooperative patient not in acute distress, oriented x3. HEENT:  Normocephalic and atraumatic. NECK:  Without masses and supple. CHEST:  Clear to auscultation with normal breath sounds. CARDIOVASCULAR:  Normal heart sounds, regular rate and rhythm.  No murmurs. ABDOMEN:  Inspection of the abdomen shows some mild distention.  No hernias nor tenderness. GENITOURINARY:  The penis without lesions.  Both testes descended.   RECTAL:  Prostate is approximately 40 grams in  size.  Adequate sphincter tone. EXTREMITIES:  Without edema nor calf tenderness. NEUROLOGIC:  Nonfocal.  LABORATORY DATA:  Pending.  IMPRESSION:  Stage T2 prostate cancer.  The patient has been presented his options and has opted for radioactive seed implantation.  PLAN:  To proceed with brachytherapy for definitive therapy ok this prostate cancer.  CN/NUANCE  D:12/17/2019 T:12/18/2019 JOB:[id number]

## 2019-12-19 ENCOUNTER — Telehealth: Payer: Self-pay | Admitting: *Deleted

## 2019-12-19 NOTE — Telephone Encounter (Signed)
CALLED PATIENT TO REMIND OF PROCEDURE FOR 12-20-19, LVM FOR A RETURN CALL

## 2019-12-20 ENCOUNTER — Other Ambulatory Visit: Payer: Self-pay

## 2019-12-20 ENCOUNTER — Ambulatory Visit (HOSPITAL_BASED_OUTPATIENT_CLINIC_OR_DEPARTMENT_OTHER): Payer: PPO | Admitting: Anesthesiology

## 2019-12-20 ENCOUNTER — Encounter (HOSPITAL_BASED_OUTPATIENT_CLINIC_OR_DEPARTMENT_OTHER)
Admission: RE | Disposition: A | Discharge status: Home / Self Care | Payer: Self-pay | Source: Home / Self Care | Attending: Urology

## 2019-12-20 ENCOUNTER — Encounter (HOSPITAL_BASED_OUTPATIENT_CLINIC_OR_DEPARTMENT_OTHER): Payer: Self-pay | Admitting: Urology

## 2019-12-20 ENCOUNTER — Ambulatory Visit (HOSPITAL_COMMUNITY): Payer: PPO

## 2019-12-20 ENCOUNTER — Ambulatory Visit (HOSPITAL_BASED_OUTPATIENT_CLINIC_OR_DEPARTMENT_OTHER)
Admission: RE | Admit: 2019-12-20 | Discharge: 2019-12-20 | Disposition: A | Discharge status: Home / Self Care | Payer: PPO | Attending: Urology | Admitting: Urology

## 2019-12-20 DIAGNOSIS — C61 Malignant neoplasm of prostate: Secondary | ICD-10-CM | POA: Diagnosis not present

## 2019-12-20 DIAGNOSIS — K219 Gastro-esophageal reflux disease without esophagitis: Secondary | ICD-10-CM | POA: Insufficient documentation

## 2019-12-20 DIAGNOSIS — Z79899 Other long term (current) drug therapy: Secondary | ICD-10-CM | POA: Insufficient documentation

## 2019-12-20 DIAGNOSIS — N400 Enlarged prostate without lower urinary tract symptoms: Secondary | ICD-10-CM | POA: Insufficient documentation

## 2019-12-20 DIAGNOSIS — Z7982 Long term (current) use of aspirin: Secondary | ICD-10-CM | POA: Diagnosis not present

## 2019-12-20 DIAGNOSIS — I10 Essential (primary) hypertension: Secondary | ICD-10-CM | POA: Diagnosis not present

## 2019-12-20 HISTORY — PX: RADIOACTIVE SEED IMPLANT: SHX5150

## 2019-12-20 HISTORY — DX: Unspecified osteoarthritis, unspecified site: M19.90

## 2019-12-20 HISTORY — PX: SPACE OAR INSTILLATION: SHX6769

## 2019-12-20 HISTORY — PX: CYSTOSCOPY: SHX5120

## 2019-12-20 HISTORY — DX: Gastro-esophageal reflux disease without esophagitis: K21.9

## 2019-12-20 HISTORY — DX: Essential (primary) hypertension: I10

## 2019-12-20 SURGERY — INSERTION, RADIATION SOURCE, PROSTATE
Anesthesia: General | Site: Rectum

## 2019-12-20 MED ORDER — MEPERIDINE HCL 25 MG/ML IJ SOLN: 6.2500 mg | INTRAMUSCULAR | Status: DC | PRN

## 2019-12-20 MED ORDER — DEXAMETHASONE SODIUM PHOSPHATE 10 MG/ML IJ SOLN
INTRAMUSCULAR | Status: DC | PRN
  Administered 2019-12-20 (×2): 5 mg via INTRAVENOUS

## 2019-12-20 MED ORDER — MIDAZOLAM HCL 2 MG/2ML IJ SOLN
INTRAMUSCULAR | Status: DC | PRN
  Administered 2019-12-20: 1 mg via INTRAVENOUS

## 2019-12-20 MED ORDER — BELLADONNA ALKALOIDS-OPIUM 16.2-60 MG RE SUPP
RECTAL | Status: AC
  Filled 2019-12-20: qty 1

## 2019-12-20 MED ORDER — LIDOCAINE 2% (20 MG/ML) 5 ML SYRINGE
INTRAMUSCULAR | Status: AC
  Filled 2019-12-20: qty 5

## 2019-12-20 MED ORDER — ONDANSETRON HCL 4 MG/2ML IJ SOLN
INTRAMUSCULAR | Status: DC | PRN
  Administered 2019-12-20: 4 mg via INTRAVENOUS

## 2019-12-20 MED ORDER — EPHEDRINE 5 MG/ML INJ
INTRAVENOUS | Status: AC
  Filled 2019-12-20: qty 10

## 2019-12-20 MED ORDER — LIDOCAINE 2% (20 MG/ML) 5 ML SYRINGE
INTRAMUSCULAR | Status: DC | PRN
  Administered 2019-12-20: 80 mg via INTRAVENOUS

## 2019-12-20 MED ORDER — ONDANSETRON HCL 4 MG/2ML IJ SOLN: 4.0000 mg | Freq: Once | INTRAMUSCULAR | Status: DC | PRN

## 2019-12-20 MED ORDER — LACTATED RINGERS IV SOLN
INTRAVENOUS | Status: DC
  Administered 2019-12-20: 1000 mL via INTRAVENOUS

## 2019-12-20 MED ORDER — PROPOFOL 10 MG/ML IV BOLUS
INTRAVENOUS | Status: AC
  Filled 2019-12-20: qty 20

## 2019-12-20 MED ORDER — HYDROMORPHONE HCL 1 MG/ML IJ SOLN
INTRAMUSCULAR | Status: AC
  Filled 2019-12-20: qty 1

## 2019-12-20 MED ORDER — DEXAMETHASONE SODIUM PHOSPHATE 10 MG/ML IJ SOLN
INTRAMUSCULAR | Status: AC
  Filled 2019-12-20: qty 1

## 2019-12-20 MED ORDER — PHENAZOPYRIDINE HCL 100 MG PO TABS
200.0000 mg | ORAL_TABLET | Freq: Three times a day (TID) | ORAL | Status: DC
  Administered 2019-12-20: 200 mg via ORAL

## 2019-12-20 MED ORDER — FENTANYL CITRATE (PF) 100 MCG/2ML IJ SOLN
INTRAMUSCULAR | Status: DC | PRN
Start: 2019-12-20 — End: 2019-12-20
  Administered 2019-12-20 (×2): 50 ug via INTRAVENOUS

## 2019-12-20 MED ORDER — SODIUM CHLORIDE 0.9 % IV SOLN
INTRAVENOUS | Status: AC | PRN
  Administered 2019-12-20: 500 mL via INTRAMUSCULAR

## 2019-12-20 MED ORDER — STERILE WATER FOR IRRIGATION IR SOLN
Status: DC | PRN
  Administered 2019-12-20: 3 mL

## 2019-12-20 MED ORDER — IOHEXOL 300 MG/ML  SOLN
INTRAMUSCULAR | Status: DC | PRN
  Administered 2019-12-20: 7 mL via URETHRAL

## 2019-12-20 MED ORDER — GLYCOPYRROLATE PF 0.2 MG/ML IJ SOSY
PREFILLED_SYRINGE | INTRAMUSCULAR | Status: AC
  Filled 2019-12-20: qty 1

## 2019-12-20 MED ORDER — WHITE PETROLATUM EX OINT
TOPICAL_OINTMENT | CUTANEOUS | Status: AC
  Filled 2019-12-20: qty 5

## 2019-12-20 MED ORDER — FENTANYL CITRATE (PF) 100 MCG/2ML IJ SOLN
INTRAMUSCULAR | Status: AC
  Filled 2019-12-20: qty 2

## 2019-12-20 MED ORDER — GLYCOPYRROLATE PF 0.2 MG/ML IJ SOSY
PREFILLED_SYRINGE | INTRAMUSCULAR | Status: DC | PRN
  Administered 2019-12-20: .2 mg via INTRAVENOUS

## 2019-12-20 MED ORDER — MIDAZOLAM HCL 2 MG/2ML IJ SOLN
INTRAMUSCULAR | Status: AC
  Filled 2019-12-20: qty 2

## 2019-12-20 MED ORDER — CIPROFLOXACIN IN D5W 400 MG/200ML IV SOLN
400.0000 mg | INTRAVENOUS | Status: AC
  Administered 2019-12-20: 400 mg via INTRAVENOUS

## 2019-12-20 MED ORDER — SODIUM CHLORIDE (PF) 0.9 % IJ SOLN
INTRAMUSCULAR | Status: DC | PRN
  Administered 2019-12-20: 3 mL via INTRAVENOUS

## 2019-12-20 MED ORDER — PROPOFOL 10 MG/ML IV BOLUS
INTRAVENOUS | Status: DC | PRN
  Administered 2019-12-20: 150 mg via INTRAVENOUS

## 2019-12-20 MED ORDER — ONDANSETRON HCL 4 MG/2ML IJ SOLN
INTRAMUSCULAR | Status: AC
  Filled 2019-12-20: qty 2

## 2019-12-20 MED ORDER — BELLADONNA ALKALOIDS-OPIUM 16.2-60 MG RE SUPP
RECTAL | Status: DC | PRN
Start: 2019-12-20 — End: 2019-12-20
  Administered 2019-12-20: 1 via RECTAL

## 2019-12-20 MED ORDER — FLEET ENEMA 7-19 GM/118ML RE ENEM: 1.0000 | ENEMA | Freq: Once | RECTAL | Status: DC

## 2019-12-20 MED ORDER — CIPROFLOXACIN IN D5W 400 MG/200ML IV SOLN
INTRAVENOUS | Status: AC
  Filled 2019-12-20: qty 200

## 2019-12-20 MED ORDER — EPHEDRINE SULFATE-NACL 50-0.9 MG/10ML-% IV SOSY
PREFILLED_SYRINGE | INTRAVENOUS | Status: DC | PRN
  Administered 2019-12-20: 10 mg via INTRAVENOUS

## 2019-12-20 MED ORDER — HYDROMORPHONE HCL 1 MG/ML IJ SOLN
0.2500 mg | INTRAMUSCULAR | Status: DC | PRN
  Administered 2019-12-20 (×2): 0.5 mg via INTRAVENOUS

## 2019-12-20 MED ORDER — PHENAZOPYRIDINE HCL 100 MG PO TABS
ORAL_TABLET | ORAL | Status: AC
  Filled 2019-12-20: qty 2

## 2019-12-20 SURGICAL SUPPLY — 40 items
BLADE CLIPPER SENSICLIP SURGIC (BLADE) ×5 IMPLANT
CATH FOLEY 2WAY SLVR  5CC 16FR (CATHETERS) ×10
CATH FOLEY 2WAY SLVR 5CC 16FR (CATHETERS) ×6 IMPLANT
CATH ROBINSON RED A/P 20FR (CATHETERS) ×5 IMPLANT
CLOTH BEACON ORANGE TIMEOUT ST (SAFETY) ×5 IMPLANT
CNTNR URN SCR LID CUP LEK RST (MISCELLANEOUS) ×6 IMPLANT
CONT SPEC 4OZ STRL OR WHT (MISCELLANEOUS) ×10
COVER BACK TABLE 60X90IN (DRAPES) ×5 IMPLANT
COVER MAYO STAND STRL (DRAPES) ×5 IMPLANT
DRAPE OEC MINIVIEW 54X84 (DRAPES) ×5 IMPLANT
DRSG TEGADERM 4X4.75 (GAUZE/BANDAGES/DRESSINGS) ×5 IMPLANT
DRSG TEGADERM 8X12 (GAUZE/BANDAGES/DRESSINGS) ×5 IMPLANT
GAUZE SPONGE 4X4 12PLY STRL LF (GAUZE/BANDAGES/DRESSINGS) ×5 IMPLANT
GLOVE BIO SURGEON STRL SZ 6.5 (GLOVE) ×4 IMPLANT
GLOVE BIO SURGEON STRL SZ7.5 (GLOVE) ×5 IMPLANT
GLOVE BIO SURGEON STRL SZ8 (GLOVE) IMPLANT
GLOVE BIO SURGEONS STRL SZ 6.5 (GLOVE) ×1
GLOVE BIOGEL PI IND STRL 7.5 (GLOVE) ×9 IMPLANT
GLOVE BIOGEL PI INDICATOR 7.5 (GLOVE) ×6
GLOVE SURG ORTHO 8.5 STRL (GLOVE) ×5 IMPLANT
GLOVE SURG SS PI 6.5 STRL IVOR (GLOVE) ×5 IMPLANT
GOWN STRL REUS W/ TWL LRG LVL3 (GOWN DISPOSABLE) ×3 IMPLANT
GOWN STRL REUS W/TWL LRG LVL3 (GOWN DISPOSABLE) ×5
GOWN STRL REUS W/TWL XL LVL3 (GOWN DISPOSABLE) ×10 IMPLANT
HOLDER FOLEY CATH W/STRAP (MISCELLANEOUS) ×5 IMPLANT
I-SEED AGX100 ×290 IMPLANT
IMPL SPACEOAR VUE SYSTEM (Spacer) ×3 IMPLANT
IMPLANT SPACEOAR VUE SYSTEM (Spacer) ×5 IMPLANT
IV NS 1000ML (IV SOLUTION) ×5
IV NS 1000ML BAXH (IV SOLUTION) ×3 IMPLANT
KIT TURNOVER CYSTO (KITS) ×5 IMPLANT
MARKER SKIN DUAL TIP RULER LAB (MISCELLANEOUS) ×5 IMPLANT
NEEDLE SPNL 22GX7 QUINCKE BK (NEEDLE) ×5 IMPLANT
PACK CYSTO (CUSTOM PROCEDURE TRAY) ×5 IMPLANT
SURGILUBE 2OZ TUBE FLIPTOP (MISCELLANEOUS) IMPLANT
SUT BONE WAX W31G (SUTURE) IMPLANT
SYR 10ML LL (SYRINGE) ×15 IMPLANT
TOWEL OR 17X26 10 PK STRL BLUE (TOWEL DISPOSABLE) ×5 IMPLANT
UNDERPAD 30X36 HEAVY ABSORB (UNDERPADS AND DIAPERS) ×10 IMPLANT
WATER STERILE IRR 500ML POUR (IV SOLUTION) ×5 IMPLANT

## 2019-12-20 NOTE — Transfer of Care (Signed)
Immediate Anesthesia Transfer of Care Note  Patient: MIHRAN LEBARRON  Procedure(s) Performed: Procedure(s) (LRB): RADIOACTIVE SEED IMPLANT/BRACHYTHERAPY IMPLANT (N/A) SPACE OAR INSTILLATION (N/A) CYSTOSCOPY FLEXIBLE (N/A)  Patient Location: PACU  Anesthesia Type: General  Level of Consciousness: awake, oriented, sedated and patient cooperative  Airway & Oxygen Therapy: Patient Spontanous Breathing and Patient connected to face mask oxygen  Post-op Assessment: Report given to PACU RN and Post -op Vital signs reviewed and stable  Post vital signs: Reviewed and stable  Complications: No apparent anesthesia complications  Last Vitals:  Vitals Value Taken Time  BP    Temp    Pulse 97 12/20/19 1602  Resp 19 12/20/19 1602  SpO2 96 % 12/20/19 1602  Vitals shown include unvalidated device data.  Last Pain:  Vitals:   12/20/19 1216  TempSrc: Oral  PainSc: 0-No pain      Patients Stated Pain Goal: 7 (20/35/59 7416)  Complications: No complications documented.

## 2019-12-20 NOTE — Brief Op Note (Signed)
12/20/2019  4:00 PM  PATIENT:  Wesley Jackson  79 y.o. male  PRE-OPERATIVE DIAGNOSIS:  prostate cancer  POST-OPERATIVE DIAGNOSIS:  prostate cancer  PROCEDURE:  Procedure(s): RADIOACTIVE SEED IMPLANT/BRACHYTHERAPY IMPLANT (N/A) SPACE OAR INSTILLATION (N/A) CYSTOSCOPY FLEXIBLE (N/A)  SURGEON:  Surgeon(s) and Role:    Joie Bimler, MD - Primary    * Tyler Pita, MD  PHYSICIAN ASSISTANT:    ASSISTANTS: none   ANESTHESIA:   general  EBL:  0cc   BLOOD ADMINISTERED:none  DRAINS: Urinary Catheter (Foley)   LOCAL MEDICATIONS USED:  NONE  SPECIMEN:  No Specimen  DISPOSITION OF SPECIMEN:  N/A  COUNTS:  YES  TOURNIQUET:  * No tourniquets in log *  DICTATION: .Other Dictation: Dictation Number [id number]  PLAN OF CARE: Discharge to home after PACU  PATIENT DISPOSITION:  PACU - hemodynamically stable.   Delay start of Pharmacological VTE agent (>24hrs) due to surgical blood loss or risk of bleeding: not applicable

## 2019-12-20 NOTE — Anesthesia Procedure Notes (Signed)
Procedure Name: LMA Insertion Date/Time: 12/20/2019 2:36 PM Performed by: Suan Halter, CRNA Pre-anesthesia Checklist: Patient identified, Emergency Drugs available, Suction available and Patient being monitored Patient Re-evaluated:Patient Re-evaluated prior to induction Oxygen Delivery Method: Circle system utilized Preoxygenation: Pre-oxygenation with 100% oxygen Induction Type: IV induction Ventilation: Mask ventilation without difficulty LMA: LMA inserted LMA Size: 4.0 Number of attempts: 1 Airway Equipment and Method: Bite block Placement Confirmation: positive ETCO2 Tube secured with: Tape Dental Injury: Teeth and Oropharynx as per pre-operative assessment

## 2019-12-20 NOTE — Anesthesia Preprocedure Evaluation (Signed)
Anesthesia Evaluation  Patient identified by MRN, date of birth, ID band Patient awake    Reviewed: Allergy & Precautions, NPO status , Patient's Chart, lab work & pertinent test results  Airway Mallampati: I  TM Distance: >3 FB Neck ROM: Full    Dental   Pulmonary    Pulmonary exam normal        Cardiovascular hypertension, Pt. on medications Normal cardiovascular exam     Neuro/Psych    GI/Hepatic GERD  Medicated and Controlled,  Endo/Other    Renal/GU      Musculoskeletal   Abdominal   Peds  Hematology   Anesthesia Other Findings   Reproductive/Obstetrics                             Anesthesia Physical Anesthesia Plan  ASA: II  Anesthesia Plan: General   Post-op Pain Management:    Induction: Intravenous  PONV Risk Score and Plan: 2 and Ondansetron and Treatment may vary due to age or medical condition  Airway Management Planned: LMA  Additional Equipment:   Intra-op Plan:   Post-operative Plan: Extubation in OR  Informed Consent: I have reviewed the patients History and Physical, chart, labs and discussed the procedure including the risks, benefits and alternatives for the proposed anesthesia with the patient or authorized representative who has indicated his/her understanding and acceptance.       Plan Discussed with: CRNA and Surgeon  Anesthesia Plan Comments:         Anesthesia Quick Evaluation

## 2019-12-20 NOTE — Op Note (Signed)
NAME: Wesley Jackson, Wesley Jackson MEDICAL RECORD YN:[id number] ACCOUNT [id number] DATE OF BIRTH:1940/12/10 FACILITY: [facility] LOCATION: [facility] PHYSICIAN:Bryceton Hantz, MD  OPERATIVE REPORT  DATE OF PROCEDURE:  12/20/2019  PREOPERATIVE DIAGNOSIS:  Stage IIB adenocarcinoma of the prostate.  POSTOPERATIVE DIAGNOSIS:  Stage IIB adenocarcinoma of the prostate.  PROCEDURE:   1.  Transrectal ultrasound.   2.  Anterior prostatic seed implantation of I-125 seeds. 3.  Cystoscopy.  SURGEON OF RECORD:  Joie Bimler, MD   COSURGEON:  Dr. Tyler Pita.  INDICATION FOR PROCEDURE:  The patient is a 79 year old gentleman with a recent diagnosis of prostate cancer involving the right glans.  He has been shown to have localized disease by staging studies and presented his options, he has opted for definitive  therapy with radiation.  He is brought in now for seed implant.  PROCEDURE IN DETAIL:  The patient was brought into the operating room and placed on the table in the supine position.  He was properly identified and after adequate general anesthesia was achieved, a Foley catheter was placed and then the patient was  carefully placed in exaggerated lithotomy with the Yellofin stirrups.  The scrotum was then elevated and taped onto the abdomen.  The perineum was shaved, prepped and draped for the performance of the procedure.  A red rubber catheter was placed into the  rectum to relieve excess air.  The transrectal ultrasound was then placed and the prostate images were scanned from apex to seminal vesicles.  The images were scanned into the ____ program.  Anchor needles were placed into the prostate.  The scanned  images were then individually reviewed by myself and Dr. Tammi Klippel and the borders of the prostatic, urethra and rectum were marked.  The computer program was then calculated the appropriate seed placement.  All of these images were then individually  reviewed by myself, the physicist, and Dr. Tammi Klippel  to ascertain seed placement.  Once we had good coverage of the prostate with sparing of the urethra and rectum, seed implantation was begun.  The bladder neck was identified with the Foley balloon in  place and contrast in the balloon.  Needles were then placed from anterior to posterior on the prostate to encompass the prostate in its entirety.  No portion of the prostate was left untreated.  Once the final seeds were placed, the anchor needles were  removed and the pelvic x-ray was obtained showing good distribution of the seeds.  Foley catheter was then removed.  The patient was then reprepped and draped and flexible cystoscopy was performed, which showed no seeds within the bladder.  The bladder  was filled and the cystoscope removed and a new 16-French Foley was then placed to drain the bladder.  Prior to the removal of the ultrasound, the OAR spacer was placed under ultrasonic guidance by placing a needle in the midline of the perineum and  under ultrasound guidance, locating the needle into the prerectal space between the prostate and rectum.  The OAR spacer was then placed and filled the potential space.  A rectal exam was then performed and this did not reveal any foreign bodies in the  rectum.  A B and O suppository was then placed for local analgesia.  The patient had tolerated all this well.  He was then awakened and taken to the recovery room in good condition.  VN/NUANCE  D:12/20/2019 T:12/20/2019 JOB:[id number]

## 2019-12-20 NOTE — Discharge Instructions (Signed)
Post Anesthesia Home Care Instructions  Activity: Get plenty of rest for the remainder of the day. A responsible individual must stay with you for 24 hours following the procedure.  For the next 24 hours, DO NOT: -Drive a car -Paediatric nurse -Drink alcoholic beverages -Take any medication unless instructed by your physician -Make any legal decisions or sign important papers.  Meals: Start with liquid foods such as gelatin or soup. Progress to regular foods as tolerated. Avoid greasy, spicy, heavy foods. If nausea and/or vomiting occur, drink only clear liquids until the nausea and/or vomiting subsides. Call your physician if vomiting continues.  Special Instructions/Symptoms: Your throat may feel dry or sore from the anesthesia or the breathing tube placed in your throat during surgery. If this causes discomfort, gargle with warm salt water. The discomfort should disappear within 24 hours.  If you had a scopolamine patch placed behind your ear for the management of post- operative nausea and/or vomiting:  1. The medication in the patch is effective for 72 hours, after which it should be removed.  Wrap patch in a tissue and discard in the trash. Wash hands thoroughly with soap and water. 2. You may remove the patch earlier than 72 hours if you experience unpleasant side effects which may include dry mouth, dizziness or visual disturbances. 3. Avoid touching the patch. Wash your hands with soap and water after contact with the patch.    Radioactive Seed Implant Home Care Instructions   Activity:    Rest for the remainder of the day.  Do not drive or operate equipment today.  You may resume normal  activities in a few days as instructed by your physician, without risk of harmful radiation exposure to those around you, provided you follow the time and distance precautions on the Radiation Oncology Instruction Sheet.   Meals: Drink plenty of lipuids and eat light foods, such as gelatin  or soup this evening .  You may return to normal meal plan tomorrow.  Return To Work: You may return to work as instructed by Naval architect.  Special Instruction:   If any seeds are found, use tweezers to pick up seeds and place in a glass container of any kind and bring to your physician's office.  Call your physician if any of these symptoms occur:   Persistent or heavy bleeding  Urine stream diminishes or stops completely after catheter is removed  Fever equal to or greater than 101 degrees F  Cloudy urine with a strong foul odor  Severe pain  You may feel some burning pain and/or hesitancy when you urinate after the catheter is removed.  These symptoms may increase over the next few weeks, but should diminish within forur to six weeks.  Applying moist heat to the lower abdomen or a hot tub bath may help relieve the pain.  If the discomfort becomes severe, please call your physician for additional medications.   Indwelling Urinary Catheter Care, Adult An indwelling urinary catheter is a thin tube that is put into your bladder. The tube helps to drain pee (urine) out of your body. The tube goes in through your urethra. Your urethra is where pee comes out of your body. Your pee will come out through the catheter, then it will go into a bag (drainage bag). Take good care of your catheter so it will work well. How to wear your catheter and bag Supplies needed  Sticky tape (adhesive tape) or a leg strap.  Alcohol wipe or soap  and water (if you use tape).  A clean towel (if you use tape).  Large overnight bag.  Smaller bag (leg bag). Wearing your catheter Attach your catheter to your leg with tape or a leg strap.  Make sure the catheter is not pulled tight.  If a leg strap gets wet, take it off and put on a dry strap.  If you use tape to hold the bag on your leg: 1. Use an alcohol wipe or soap and water to wash your skin where the tape made it sticky before. 2. Use a  clean towel to pat-dry that skin. 3. Use new tape to make the bag stay on your leg. Wearing your bags You should have been given a large overnight bag.  You may wear the overnight bag in the day or night.  Always have the overnight bag lower than your bladder.  Do not let the bag touch the floor.  Before you go to sleep, put a clean plastic bag in a wastebasket. Then hang the overnight bag inside the wastebasket. You should also have a smaller leg bag that fits under your clothes.  Always wear the leg bag below your knee.  Do not wear your leg bag at night. How to care for your skin and catheter Supplies needed  A clean washcloth.  Water and mild soap.  A clean towel. Caring for your skin and catheter      Clean the skin around your catheter every day: 1. Wash your hands with soap and water. 2. Wet a clean washcloth in warm water and mild soap. 3. Clean the skin around your urethra.  If you are male:  Gently spread the folds of skin around your vagina (labia).  With the washcloth in your other hand, wipe the inner side of your labia on each side. Wipe from front to back.  If you are male:  Pull back any skin that covers the end of your penis (foreskin).  With the washcloth in your other hand, wipe your penis in small circles. Start wiping at the tip of your penis, then move away from the catheter.  Move the foreskin back in place, if needed. 4. With your free hand, hold the catheter close to where it goes into your body.  Keep holding the catheter during cleaning so it does not get pulled out. 5. With the washcloth in your other hand, clean the catheter.  Only wipe downward on the catheter.  Do not wipe upward toward your body. Doing this may push germs into your urethra and cause infection. 6. Use a clean towel to pat-dry the catheter and the skin around it. Make sure to wipe off all soap. 7. Wash your hands with soap and water.  Shower every day. Do not  take baths.  Do not use cream, ointment, or lotion on the area where the catheter goes into your body, unless your doctor tells you to.  Do not use powders, sprays, or lotions on your genital area.  Check your skin around the catheter every day for signs of infection. Check for: ? Redness, swelling, or pain. ? Fluid or blood. ? Warmth. ? Pus or a bad smell. How to empty the bag Supplies needed  Rubbing alcohol.  Gauze pad or cotton ball.  Tape or a leg strap. Emptying the bag Pour the pee out of your bag when it is ?- full, or at least 2-3 times a day. Do this for your overnight bag and your  leg bag. 1. Wash your hands with soap and water. 2. Separate (detach) the bag from your leg. 3. Hold the bag over the toilet or a clean pail. Keep the bag lower than your hips and bladder. This is so the pee (urine) does not go back into the tube. 4. Open the pour spout. It is at the bottom of the bag. 5. Empty the pee into the toilet or pail. Do not let the pour spout touch any surface. 6. Put rubbing alcohol on a gauze pad or cotton ball. 7. Use the gauze pad or cotton ball to clean the pour spout. 8. Close the pour spout. 9. Attach the bag to your leg with tape or a leg strap. 10. Wash your hands with soap and water. Follow instructions for cleaning the drainage bag:  From the product maker.  As told by your doctor. How to change the bag Supplies needed  Alcohol wipes.  A clean bag.  Tape or a leg strap. Changing the bag Replace your bag when it starts to leak, smell bad, or look dirty. 1. Wash your hands with soap and water. 2. Separate the dirty bag from your leg. 3. Pinch the catheter with your fingers so that pee does not spill out. 4. Separate the catheter tube from the bag tube where these tubes connect (at the connection valve). Do not let the tubes touch any surface. 5. Clean the end of the catheter tube with an alcohol wipe. Use a different alcohol wipe to clean the  end of the bag tube. 6. Connect the catheter tube to the tube of the clean bag. 7. Attach the clean bag to your leg with tape or a leg strap. Do not make the bag tight on your leg. 8. Wash your hands with soap and water. General rules   Never pull on your catheter. Never try to take it out. Doing that can hurt you.  Always wash your hands before and after you touch your catheter or bag. Use a mild, fragrance-free soap. If you do not have soap and water, use hand sanitizer.  Always make sure there are no twists or bends (kinks) in the catheter tube.  Always make sure there are no leaks in the catheter or bag.  Drink enough fluid to keep your pee pale yellow.  Do not take baths, swim, or use a hot tub.  If you are male, wipe from front to back after you poop (have a bowel movement). Contact a doctor if:  Your pee is cloudy.  Your pee smells worse than usual.  Your catheter gets clogged.  Your catheter leaks.  Your bladder feels full. Get help right away if:  You have redness, swelling, or pain where the catheter goes into your body.  You have fluid, blood, pus, or a bad smell coming from the area where the catheter goes into your body.  Your skin feels warm where the catheter goes into your body.  You have a fever.  You have pain in your: ? Belly (abdomen). ? Legs. ? Lower back. ? Bladder.  You see blood in the catheter.  Your pee is pink or red.  You feel sick to your stomach (nauseous).  You throw up (vomit).  You have chills.  Your pee is not draining into the bag.  Your catheter gets pulled out. Summary  An indwelling urinary catheter is a thin tube that is placed into the bladder to help drain pee (urine) out of the body.  The catheter is placed into the part of the body that drains pee from the bladder (urethra).  Taking good care of your catheter will keep it working properly and help prevent problems.  Always wash your hands before and after  touching your catheter or bag.  Never pull on your catheter or try to take it out. This information is not intended to replace advice given to you by your health care provider. Make sure you discuss any questions you have with your health care provider. Document Revised: 08/04/2018 Document Reviewed: 11/26/2016 Elsevier Patient Education  Wesley Jackson.

## 2019-12-21 ENCOUNTER — Encounter (HOSPITAL_BASED_OUTPATIENT_CLINIC_OR_DEPARTMENT_OTHER): Payer: Self-pay | Admitting: Urology

## 2019-12-21 DIAGNOSIS — C61 Malignant neoplasm of prostate: Secondary | ICD-10-CM | POA: Diagnosis not present

## 2019-12-22 NOTE — Anesthesia Postprocedure Evaluation (Signed)
Anesthesia Post Note  Patient: Wesley Jackson  Procedure(s) Performed: RADIOACTIVE SEED IMPLANT/BRACHYTHERAPY IMPLANT (N/A Prostate) SPACE OAR INSTILLATION (N/A Rectum) CYSTOSCOPY FLEXIBLE (N/A Bladder)     Patient location during evaluation: PACU Anesthesia Type: General Level of consciousness: awake and alert Pain management: pain level controlled Vital Signs Assessment: post-procedure vital signs reviewed and stable Respiratory status: spontaneous breathing, nonlabored ventilation, respiratory function stable and patient connected to nasal cannula oxygen Cardiovascular status: blood pressure returned to baseline and stable Postop Assessment: no apparent nausea or vomiting Anesthetic complications: no   No complications documented.  Last Vitals:  Vitals:   12/20/19 1630 12/20/19 1710  BP: 127/64 132/60  Pulse: 71 70  Resp: 13 16  Temp:  (!) 36.1 C  SpO2: 100% 94%    Last Pain:  Vitals:   12/21/19 1023  TempSrc:   PainSc: 0-No pain                 Kilie Rund DAVID

## 2019-12-25 DIAGNOSIS — R339 Retention of urine, unspecified: Secondary | ICD-10-CM | POA: Diagnosis not present

## 2019-12-25 DIAGNOSIS — C61 Malignant neoplasm of prostate: Secondary | ICD-10-CM | POA: Diagnosis not present

## 2019-12-26 NOTE — Progress Notes (Signed)
  Radiation Oncology         [phone] ________________________________  Name: Wesley Jackson MRN: [id number]  Date: 12/26/2019  DOB: 1940/05/20       Prostate Seed Implant  [id number], Angelique Blonder, MD  No ref. provider found  DIAGNOSIS:  Oncology History  Malignant neoplasm of prostate ([place])  07/31/2019 Cancer Staging   Staging form: Prostate, AJCC 8th Edition - Clinical stage from 07/31/2019: Stage IIB (cT1c, cN0, cM0, PSA: 5.1, Grade Group: 2) - Signed by Freeman Caldron, PA-C on 09/18/2019   09/18/2019 Initial Diagnosis   Malignant neoplasm of prostate ([place])     No diagnosis found.  PROCEDURE: Insertion of radioactive I-125 seeds into the prostate gland.  RADIATION DOSE: 145 Gy, definitive therapy.  TECHNIQUE: Wesley Jackson was brought to the operating room with the urologist. He was placed in the dorsolithotomy position. He was catheterized and a rectal tube was inserted. The perineum was shaved, prepped and draped. The ultrasound probe was then introduced into the rectum to see the prostate gland.  TREATMENT DEVICE: A needle grid was attached to the ultrasound probe stand and anchor needles were placed.  3D PLANNING: The prostate was imaged in 3D using a sagittal sweep of the prostate probe. These images were transferred to the planning computer. There, the prostate, urethra and rectum were defined on each axial reconstructed image. Then, the software created an optimized 3D plan and a few seed positions were adjusted. The quality of the plan was reviewed using [facility] information for the target and the following two organs at risk:  Urethra and Rectum.  Then the accepted plan was printed and handed off to the radiation therapist.  Under my supervision, the custom loading of the seeds and spacers was carried out and loaded into sealed vicryl sleeves.  These pre-loaded needles were then placed into the needle holder.Marland Kitchen  PROSTATE VOLUME STUDY:  Using transrectal ultrasound the volume of  the prostate was verified to be 57.6 cc.  SPECIAL TREATMENT PROCEDURE/SUPERVISION AND HANDLING: The pre-loaded needles were then delivered under sagittal guidance. A total of 19 needles were used to deposit 58 seeds in the prostate gland. The individual seed activity was 0.677 mCi.  SpaceOAR:  Yes  COMPLEX SIMULATION: At the end of the procedure, an anterior radiograph of the pelvis was obtained to document seed positioning and count. Cystoscopy was performed to check the urethra and bladder.  MICRODOSIMETRY: At the end of the procedure, the patient was emitting 0.1 mR/hr at 1 meter. Accordingly, he was considered safe for hospital discharge.  PLAN: The patient will return to the radiation oncology clinic for post implant CT dosimetry in three weeks.   ________________________________  Sheral Apley Tammi Klippel, M.D.

## 2019-12-28 DIAGNOSIS — C61 Malignant neoplasm of prostate: Secondary | ICD-10-CM | POA: Diagnosis not present

## 2019-12-28 DIAGNOSIS — R339 Retention of urine, unspecified: Secondary | ICD-10-CM | POA: Diagnosis not present

## 2019-12-29 DIAGNOSIS — R338 Other retention of urine: Secondary | ICD-10-CM | POA: Diagnosis not present

## 2019-12-29 DIAGNOSIS — R339 Retention of urine, unspecified: Secondary | ICD-10-CM | POA: Diagnosis not present

## 2019-12-29 DIAGNOSIS — Z79899 Other long term (current) drug therapy: Secondary | ICD-10-CM | POA: Diagnosis not present

## 2019-12-29 DIAGNOSIS — R1030 Lower abdominal pain, unspecified: Secondary | ICD-10-CM | POA: Diagnosis not present

## 2019-12-29 DIAGNOSIS — Z9889 Other specified postprocedural states: Secondary | ICD-10-CM | POA: Diagnosis not present

## 2019-12-29 DIAGNOSIS — I10 Essential (primary) hypertension: Secondary | ICD-10-CM | POA: Diagnosis not present

## 2020-01-07 ENCOUNTER — Other Ambulatory Visit: Payer: Self-pay | Admitting: Radiation Oncology

## 2020-01-07 ENCOUNTER — Telehealth: Payer: Self-pay | Admitting: Radiation Oncology

## 2020-01-07 MED ORDER — HYDROCORTISONE ACETATE 25 MG RE SUPP: 25.0000 mg | Freq: Two times a day (BID) | RECTAL | 0 refills | Status: DC

## 2020-01-07 NOTE — Telephone Encounter (Signed)
Phoned patient back. Spoke with wife. Explained that a script for anusol suppositories has been escribed to their preferred pharmacy to manage rectal fullness and pain. Directed upon use. Encouraged sitz baths prn. Answered all questions to the best of my ability. Wife verbalized understanding of all reviewed.

## 2020-01-07 NOTE — Telephone Encounter (Signed)
-----   Message from Hayden Pedro, [place] sent at 01/07/2020 11:24 AM EDT ----- Regarding: RE: Severe rectal pain Sam I agree perhaps he could try some anusol suppositories. If he'd like I can call those in, it's twice daily for 12 days that I give our rectal cancer pts when they have fullness, pain and urgency. ----- Message ----- From: Heywood Footman, RN Sent: 01/07/2020  10:15 AM EDT To: Tyler Pita, MD, Ashlyn Bruning, PA-C, # Subject: Severe rectal pain                             Stage T1c adenocarcinoma of the prostate with Gleason score of 3+4, and PSA of 5.1. S/p seed implant 12/17/2019 with Dr. Nila Nephew. Post seed follow up appointment scheduled for 01/17/2020.   Received a call from the patient's wife that her husband has had severe rectal pain since prostate seed implant on 12/20/2019. Reports taking oxycodone 5 mg bid as directed by Dr. Nila Nephew. Reports his rectal pain is constant without relief. Denies problems having a bowel movements. Reports consistency of stool is a mix of hard and soft. Denies seeing blood in stool. Denies a history of hemorrhoids. Denies using any OTC products for relief. Reports having a urinary catheter on and off since seed implant.   Wife requested an in person appointment but I am not sure that's necessary since no interventions other than oxy  5 mg have been attempted. Wife and patient understand I will phone them back with some directions after I receive input.  Sam

## 2020-01-10 DIAGNOSIS — R339 Retention of urine, unspecified: Secondary | ICD-10-CM | POA: Diagnosis not present

## 2020-01-16 ENCOUNTER — Telehealth: Payer: Self-pay | Admitting: *Deleted

## 2020-01-16 NOTE — Telephone Encounter (Signed)
Called patient to remind of post seed appts. for 01-17-20, lvm for a return call

## 2020-01-16 NOTE — Progress Notes (Signed)
Radiation Oncology         [phone] ________________________________  Name: Wesley Jackson MRN: [id number]  Date: 01/17/2020  DOB: 22-Mar-1941  Post-Seed Follow-Up Visit Note  CC: Angelina Sheriff, MD  Joie Bimler, MD  Diagnosis:   79 y.o. gentleman with Stage T1c adenocarcinoma of the prostate with Gleason score of 3+4, and PSA of 5.1.    ICD-10-CM   1. Malignant neoplasm of prostate (HCC)  C61     Interval Since Last Radiation:  4 weeks 12/20/19:  Insertion of radioactive I-125 seeds into the prostate gland; 145 Gy, definitive therapy with placement of SpaceOAR VUE gel.  Narrative:  The patient returns today for routine follow-up.  He developed AUR following the procedure and continues with indwelling foley catheter, having failed 2 voiding trials to date. He was advised to increase his Flomax to BID and started on 2 new medications in addition to the Flomax at his most recent urology visit last week. He is scheduled for repeat voiding trial and follow up with Dr. Nila Nephew on 01/18/20. His pre-implant score was 7. He also continues to struggle rectal pain and pressure as well as frequent loose stools.  He rectal discomfort is worse with sitting any period of time and improves with standing. He has tried Annusol suppositories without relief. He is taking Ibuprofen as directed to help with inflammation. He denies seeing any blood in the stools and reports that some days he only has 1-2 loose stools and other days he may have 4+. He also continues with modest fatigue but denies fever, chills or night sweats.  ALLERGIES:  has No Known Allergies.  Meds: Current Outpatient Medications  Medication Sig Dispense Refill  . acetaminophen (TYLENOL) 325 MG tablet Take by mouth.    Marland Kitchen amLODipine (NORVASC) 5 MG tablet Take 5 mg by mouth daily.  5  . augmented betamethasone dipropionate (DIPROLENE-AF) 0.05 % cream APPLY TOPICALLY TWICE A DAY AS NEEDED    . hydrocortisone (ANUSOL-HC) 25 MG suppository  Place 1 suppository (25 mg total) rectally 2 (two) times daily. 24 suppository 0  . omeprazole (PRILOSEC) 20 MG capsule Take 20 mg by mouth every morning.  1  . tadalafil (CIALIS) 5 MG tablet Take 5 mg by mouth daily.     . tamsulosin (FLOMAX) 0.4 MG CAPS capsule TAKE 1 CAPSULE EVERY DAY AT BEDTIME     No current facility-administered medications for this visit.    Physical Findings: In general this is a well appearing Caucasian male in no acute distress. He's alert and oriented x4 and appropriate throughout the examination. Cardiopulmonary assessment is negative for acute distress and he exhibits normal effort.   Lab Findings: Lab Results  Component Value Date   WBC 5.0 12/17/2019   HGB 14.3 12/17/2019   HCT 43.3 12/17/2019   MCV 88.0 12/17/2019   PLT 208 12/17/2019    Radiographic Findings:  Patient underwent CT imaging in our clinic for post implant dosimetry. The CT will be reviewed by Dr. Tammi Klippel to confirm there is an adequate distribution of radioactive seeds throughout the prostate gland and ensure that there are no seeds in or near the rectum. We suspect the final radiation plan and dosimetry will show appropriate coverage of the prostate gland. He understands that we will call and inform him of any unexpected findings on further review of his imaging and dosimetry.  Impression/Plan: 79 y.o. gentleman with Stage T1c adenocarcinoma of the prostate with Gleason score of 3+4, and PSA  of 5.1. The patient is recovering from the effects of radiation. His urinary symptoms should gradually improve over the next 4-6 months. We talked about this today. He is encouraged by his improvement already and is otherwise pleased with his outcome. We also talked about long-term follow-up for prostate cancer following seed implant. He understands that ongoing PSA determinations and digital rectal exams will help perform surveillance to rule out disease recurrence. He has a follow up appointment  scheduled with Dr. Nila Nephew on 01/18/20. He understands what to expect with his PSA measures. For the persistent rectal discomfort, we will try Proctozone suppositories to see if these provide more relief than the Anusol. I will also send a Rx for Percocet to use sparing, mainly to help him rest at night since this has been helpful previously. Patient was also educated today about some of the long-term effects from radiation including a small risk for rectal bleeding and possibly erectile dysfunction. We talked about some of the general management approaches to these potential complications. However, I did encourage the patient to contact our office or return at any point if he has questions or concerns related to his previous radiation and prostate cancer.    Nicholos Johns, PA-C

## 2020-01-17 ENCOUNTER — Ambulatory Visit
Admission: RE | Admit: 2020-01-17 | Discharge: 2020-01-17 | Disposition: A | Discharge status: Home / Self Care | Payer: PPO | Source: Ambulatory Visit | Attending: Urology | Admitting: Urology

## 2020-01-17 ENCOUNTER — Encounter: Payer: Self-pay | Admitting: Urology

## 2020-01-17 ENCOUNTER — Other Ambulatory Visit: Payer: Self-pay

## 2020-01-17 ENCOUNTER — Ambulatory Visit
Admission: RE | Admit: 2020-01-17 | Discharge: 2020-01-17 | Disposition: A | Discharge status: Home / Self Care | Payer: PPO | Source: Ambulatory Visit | Attending: Radiation Oncology | Admitting: Radiation Oncology

## 2020-01-17 VITALS — BP 156/76 | HR 75 | Temp 97.7°F | Resp 20 | Ht 69.0 in | Wt 187.2 lb

## 2020-01-17 DIAGNOSIS — K6289 Other specified diseases of anus and rectum: Secondary | ICD-10-CM | POA: Insufficient documentation

## 2020-01-17 DIAGNOSIS — R197 Diarrhea, unspecified: Secondary | ICD-10-CM | POA: Insufficient documentation

## 2020-01-17 DIAGNOSIS — C61 Malignant neoplasm of prostate: Secondary | ICD-10-CM

## 2020-01-17 DIAGNOSIS — Z79899 Other long term (current) drug therapy: Secondary | ICD-10-CM | POA: Diagnosis not present

## 2020-01-17 DIAGNOSIS — Z923 Personal history of irradiation: Secondary | ICD-10-CM | POA: Diagnosis not present

## 2020-01-17 MED ORDER — OXYCODONE-ACETAMINOPHEN 5-325 MG PO TABS
1.0000 | ORAL_TABLET | ORAL | 0 refills | Status: DC | PRN
Start: 2020-01-17 — End: 2020-02-26

## 2020-01-17 MED ORDER — HYDROCORTISONE (PERIANAL) 2.5 % EX CREA: 1.0000 "application " | TOPICAL_CREAM | Freq: Two times a day (BID) | CUTANEOUS | 0 refills | Status: DC

## 2020-01-17 NOTE — Progress Notes (Signed)
  Radiation Oncology         [phone] ________________________________  Name: Wesley Jackson MRN: [id number]  Date: 01/17/2020  DOB: [birth year]  COMPLEX SIMULATION NOTE  NARRATIVE:  The patient was brought to the [facility] today following prostate seed implantation approximately one month ago.  Identity was confirmed.  All relevant records and images related to the planned course of therapy were reviewed.  Then, the patient was set-up supine.  CT images were obtained.  The CT images were loaded into the planning software.  Then the prostate and rectum were contoured.  Treatment planning then occurred.  The implanted iodine 125 seeds were identified by the physics staff for projection of radiation distribution  I have requested : 3D Simulation  I have requested a DVH of the following structures: Prostate and rectum.    ________________________________  Sheral Apley Tammi Klippel, M.D.

## 2020-01-18 ENCOUNTER — Telehealth: Payer: Self-pay | Admitting: *Deleted

## 2020-01-18 DIAGNOSIS — R339 Retention of urine, unspecified: Secondary | ICD-10-CM | POA: Diagnosis not present

## 2020-01-18 DIAGNOSIS — C61 Malignant neoplasm of prostate: Secondary | ICD-10-CM | POA: Diagnosis not present

## 2020-01-18 NOTE — Telephone Encounter (Signed)
CALLED PATIENT TO INFORM THAT THIS MESSAGE WOULD BE PASSED ALONG TO DR. MANNING AND THAT HE WOULD CALL DR. CHAO THIS AFTERNOON

## 2020-01-22 ENCOUNTER — Telehealth: Payer: Self-pay | Admitting: *Deleted

## 2020-01-22 DIAGNOSIS — R339 Retention of urine, unspecified: Secondary | ICD-10-CM | POA: Diagnosis not present

## 2020-01-22 DIAGNOSIS — C61 Malignant neoplasm of prostate: Secondary | ICD-10-CM | POA: Diagnosis not present

## 2020-01-22 NOTE — Telephone Encounter (Signed)
RETURNED PATIENT'S PHONE CALL, SPOKE WITH PATIENT. ?

## 2020-01-23 ENCOUNTER — Telehealth: Payer: Self-pay | Admitting: Radiation Oncology

## 2020-01-23 DIAGNOSIS — C61 Malignant neoplasm of prostate: Secondary | ICD-10-CM | POA: Diagnosis not present

## 2020-01-23 DIAGNOSIS — R339 Retention of urine, unspecified: Secondary | ICD-10-CM | POA: Diagnosis not present

## 2020-01-23 DIAGNOSIS — N3289 Other specified disorders of bladder: Secondary | ICD-10-CM | POA: Diagnosis not present

## 2020-01-23 NOTE — Telephone Encounter (Signed)
I called and spoke with the patient's wife, [name], to reinforce the information that you have provided thus far and to be a listening ear.  They are incredibly frustrated with the ongoing LUTS that have been refractory to multiple medications at this point. It does sound like the rectal pressure/discomfort has improved, now mainly difficulty with dysuria, weak stream and incomplete emptying. Sounds like he is having a lot of bladder spasms.  He was given Uribel today and has just started taking this medication so I am hopeful that this will provide relief.  I reassured them that we would be back in contact just as soon as we have the final dosimetry report. She was grateful for the call and stated her understanding of that discussed.  They are anxiously awaiting the report. -Jaysie Benthall

## 2020-01-23 NOTE — Telephone Encounter (Signed)
Phoned patient. Spoke with patient's wife, Wesley Jackson. Listened as she voiced her frustrations about what her husband has experienced since the surgery on August 26. Explained the post seed CT appears normal, on informal  inspection. Explained the formal post implant dosimetry is still pending. Explained we look forward to reviewing the dose distribution as soon as its available and getting back to them since he is having such severe LUTS and rectal issues. Wife wanted to know time frame in which they would hear back. Assured her it should be soon but no specific time frame given.   Wesley Jackson states, "Wesley Jackson continues to have lots of trouble with urination and burning." She reports he "was up all night last night" voiding. She explains they were seen by Dr. Nila Nephew this morning and given a new medication. She reports Commodore is to go back to Dr. Nila Nephew this afternoon to have a catheter placed if his symptoms aren't any better with this new medication. Assured Wesley Jackson this RN would pass along the information she has provided to the radiation oncology providers. She verbalized that she really would like to hear from [employer], PA-C.

## 2020-01-23 NOTE — Telephone Encounter (Signed)
-----   Message from Freeman Caldron, [place] sent at 01/21/2020  9:12 AM EDT ----- Regarding: FW: POST SEED SCAN Sam, Will you please call the patient and Dr. Nila Nephew with this information as below and let them know that as soon as more information is available, we will be back in touch? Thank you! -Ashlyn  ----- Message ----- From: Tyler Pita, MD Sent: 01/20/2020   7:21 PM EDT To: Freeman Caldron, PA-C, Kerri Perches Subject: RE: POST SEED SCAN                             It appears normal, on informal inspection.  The formal post-implant dosimetry is still pending.  In light of his more severe urinary and rectal issues, we'll look forward to reviewing the dose distributions as soon as they are available. MM  ----- Message ----- From: Kerri Perches Sent: 01/18/2020   1:59 PM EDT To: Tyler Pita, MD Subject: POST SEED SCAN                                 Good afternoon Dr. Tammi Klippel,   This patient called wanting to know about his post seed CT, he said that Dr. Nila Nephew was wanting to know about it.  Mr. Suarez had it yesterday.  Enid Derry

## 2020-01-24 DIAGNOSIS — N390 Urinary tract infection, site not specified: Secondary | ICD-10-CM | POA: Diagnosis not present

## 2020-01-24 DIAGNOSIS — C61 Malignant neoplasm of prostate: Secondary | ICD-10-CM | POA: Diagnosis not present

## 2020-01-24 DIAGNOSIS — R339 Retention of urine, unspecified: Secondary | ICD-10-CM | POA: Diagnosis not present

## 2020-01-29 DIAGNOSIS — R339 Retention of urine, unspecified: Secondary | ICD-10-CM | POA: Diagnosis not present

## 2020-01-29 DIAGNOSIS — C61 Malignant neoplasm of prostate: Secondary | ICD-10-CM | POA: Diagnosis not present

## 2020-01-29 DIAGNOSIS — N390 Urinary tract infection, site not specified: Secondary | ICD-10-CM | POA: Diagnosis not present

## 2020-02-05 ENCOUNTER — Telehealth: Payer: Self-pay | Admitting: *Deleted

## 2020-02-05 DIAGNOSIS — C61 Malignant neoplasm of prostate: Secondary | ICD-10-CM | POA: Diagnosis not present

## 2020-02-05 DIAGNOSIS — R339 Retention of urine, unspecified: Secondary | ICD-10-CM | POA: Diagnosis not present

## 2020-02-05 NOTE — Telephone Encounter (Signed)
Returned patient's phone call, spoke with patient 

## 2020-02-11 ENCOUNTER — Encounter: Payer: Self-pay | Admitting: Radiation Oncology

## 2020-02-11 ENCOUNTER — Encounter: Payer: Self-pay | Admitting: Medical Oncology

## 2020-02-11 ENCOUNTER — Ambulatory Visit
Admission: RE | Admit: 2020-02-11 | Discharge: 2020-02-11 | Disposition: A | Discharge status: Home / Self Care | Payer: PPO | Source: Ambulatory Visit | Attending: Radiation Oncology | Admitting: Radiation Oncology

## 2020-02-11 ENCOUNTER — Telehealth: Payer: Self-pay | Admitting: Radiation Oncology

## 2020-02-11 DIAGNOSIS — C61 Malignant neoplasm of prostate: Secondary | ICD-10-CM | POA: Insufficient documentation

## 2020-02-11 DIAGNOSIS — D510 Vitamin B12 deficiency anemia due to intrinsic factor deficiency: Secondary | ICD-10-CM | POA: Diagnosis not present

## 2020-02-11 NOTE — Progress Notes (Signed)
  Radiation Oncology         [phone] ________________________________  Name: Wesley Jackson MRN: [id number]  Date: 02/11/2020  DOB: 12/24/1940  3D Planning Note   Prostate Brachytherapy Post-Implant Dosimetry  Diagnosis: 79 y.o. gentleman with Stage T1c adenocarcinoma of the prostate with Gleason score of 3+4, and PSA of 5.1.  Narrative: On a previous date, Wesley Jackson returned following prostate seed implantation for post implant planning. He underwent CT scan complex simulation to delineate the three-dimensional structures of the pelvis and demonstrate the radiation distribution.  Since that time, the seed localization, and complex isodose planning with dose volume histograms have now been completed.  Results:   Prostate Coverage - The dose of radiation delivered to the 90% or more of the prostate gland (D90) was 108.65% of the prescription dose. This exceeds our goal of greater than 90%. Rectal Sparing - The volume of rectal tissue receiving the prescription dose or higher was 0.0 cc. This falls under our thresholds tolerance of 1.0 cc.  Impression: The prostate seed implant appears to show adequate target coverage and appropriate rectal sparing.  Plan:  The patient will continue to follow with urology for ongoing PSA determinations. I would anticipate a high likelihood for local tumor control with minimal risk for rectal morbidity.  ________________________________  Sheral Apley Tammi Klippel, M.D.

## 2020-02-11 NOTE — Telephone Encounter (Signed)
Received voicemail message from patient requesting return call. Phoned patient at number provided on voicemail [phone]. Patient inquiring if all radiation records have been sent to Dr. Nila Nephew. Assured patient they had. Patient request radiation records be sent to his home. Explained records will be sent out today. Records printed and mailed to patient's home.  Patient reports he is doing fair. Patient questions if I know anything about the dosimetry report. I stated my understanding that the dosimetry report did not reveal any abnormalities to explain his lower urinary tract symptoms. Patient verbalized understanding. Patient states, "that's ok my wife and I are in [place]" after I explained I didn't suspect the records would arrive until later this week.

## 2020-02-12 DIAGNOSIS — C61 Malignant neoplasm of prostate: Secondary | ICD-10-CM | POA: Diagnosis not present

## 2020-02-12 DIAGNOSIS — R339 Retention of urine, unspecified: Secondary | ICD-10-CM | POA: Diagnosis not present

## 2020-02-13 ENCOUNTER — Telehealth: Payer: Self-pay | Admitting: *Deleted

## 2020-02-13 NOTE — Telephone Encounter (Signed)
CALLED PATIENT TO INFORM OF FU WITH DR. MANNING ON 02-26-20 @ 4 PM, SPOKE WITH PATIENT'S WIFE- MARTHA AND SHE IS AWARE OF THIS APPT.

## 2020-02-26 ENCOUNTER — Ambulatory Visit
Admission: RE | Admit: 2020-02-26 | Discharge: 2020-02-26 | Disposition: A | Discharge status: Home / Self Care | Payer: PPO | Source: Ambulatory Visit | Attending: Radiation Oncology | Admitting: Radiation Oncology

## 2020-02-26 ENCOUNTER — Other Ambulatory Visit: Payer: Self-pay

## 2020-02-26 VITALS — BP 141/71 | HR 71 | Temp 98.4°F | Resp 18 | Ht 69.0 in | Wt 190.2 lb

## 2020-02-26 DIAGNOSIS — C61 Malignant neoplasm of prostate: Secondary | ICD-10-CM | POA: Diagnosis not present

## 2020-02-26 NOTE — Progress Notes (Signed)
Patient returns today for follow up with Dr. Tammi Klippel. Weight and vitals stable. Able to sit with much greater ease today. Perineal pain has almost completely resolved. Patient explains he was seen by Dr. Nila Nephew approximately ten days ago and had another catheter removed. Patient explains he is tolerating things much better this time (after the removal of the catheter). Reports nocturia x 3. Reports urinary urgency with manageable pressure. Reports it no longer feels like he is passing razor blades when voiding. Patient reports he is scheduled to follow up with Dr. Nila Nephew at the end of November for a PSA check. Patient reports bowels are loose and frequent but anusol and protofoam are no longer needed.   BP (!) 141/71 (BP Location: Left Arm, Patient Position: Sitting, Cuff Size: Normal)    Pulse 71    Temp 98.4 F (36.9 C)    Resp 18    Ht 5\' 9"  (1.753 m)    Wt 190 lb 3.2 oz (86.3 kg)    SpO2 98%    BMI 28.09 kg/m  Wt Readings from Last 3 Encounters:  02/26/20 190 lb 3.2 oz (86.3 kg)  01/17/20 187 lb 3.2 oz (84.9 kg)  12/20/19 188 lb 3.2 oz (85.4 kg)

## 2020-03-01 NOTE — Progress Notes (Signed)
  Radiation Oncology         [phone] ________________________________  Name: Wesley Jackson MRN: [id number]  Date: 02/26/2020  DOB: [birth year]  Chart Note:  This patient had prostate seed implant with me and Dr. Nila Nephew on 12/20/19.  Post-operatively, he suffered with significant perineal and rectal pain and also went into urinary retention requiring catheterization.  He was not expecting these issues, and accordingly wondered if the seed implant went according to plan.  I offered to review his pre-implant, intraop, and post-implant imaging with him and discuss his situation.  So, he returned to the clinic today to do this.  I personally pulled up all of the patient's imaging and radiation planning information.  I reassured him that the quality of his implant was actually quite good with excellent coverage of the prostate gland as well as sparing of the rectum and central prostate and the location of the urethra.  His radiation dosimetry exceeded all of our quality metrics.  In this respect, his prostate seed implant was picture perfect.  When each of these goals is achieved, the likelihood for local tumor control and full recovery are very high.  Fortunately, the patient's symptoms from his postoperative issues have gradually resolved in recent weeks.  He is now able to urinate without a catheter and his postoperative pain has largely resolved.  I reassured him that he should continue to improve over time.  I explained that 1% of prostate seed implant patients go into urinary retention following the procedure, and this is an unpredictable situation.  Mr. Mena understands that his treatment met all of our expectations, and that he was in the unfortunate minority of patients who experience postoperative issues.  The patient seemed satisfied after today's discussion.  I encouraged him to call us again if he has any additional questions or issues arise.  Otherwise, he plans to follow closely with Dr. Nila Nephew in  [place].  ________________________________  Sheral Apley. Tammi Klippel, M.D.

## 2020-03-04 DIAGNOSIS — J329 Chronic sinusitis, unspecified: Secondary | ICD-10-CM | POA: Diagnosis not present

## 2020-03-04 DIAGNOSIS — J40 Bronchitis, not specified as acute or chronic: Secondary | ICD-10-CM | POA: Diagnosis not present

## 2020-03-04 DIAGNOSIS — Z8546 Personal history of malignant neoplasm of prostate: Secondary | ICD-10-CM | POA: Diagnosis not present

## 2020-03-10 DIAGNOSIS — I10 Essential (primary) hypertension: Secondary | ICD-10-CM | POA: Diagnosis not present

## 2020-03-10 DIAGNOSIS — U071 COVID-19: Secondary | ICD-10-CM | POA: Diagnosis not present

## 2020-04-01 DIAGNOSIS — C61 Malignant neoplasm of prostate: Secondary | ICD-10-CM | POA: Diagnosis not present

## 2020-04-01 DIAGNOSIS — N390 Urinary tract infection, site not specified: Secondary | ICD-10-CM | POA: Diagnosis not present

## 2020-04-14 DIAGNOSIS — D510 Vitamin B12 deficiency anemia due to intrinsic factor deficiency: Secondary | ICD-10-CM | POA: Diagnosis not present

## 2020-04-15 DIAGNOSIS — I10 Essential (primary) hypertension: Secondary | ICD-10-CM | POA: Diagnosis not present

## 2020-04-15 DIAGNOSIS — Z6827 Body mass index (BMI) 27.0-27.9, adult: Secondary | ICD-10-CM | POA: Diagnosis not present

## 2020-04-15 DIAGNOSIS — R5383 Other fatigue: Secondary | ICD-10-CM | POA: Diagnosis not present

## 2020-04-15 DIAGNOSIS — Z0000 Encounter for general adult medical examination without abnormal findings: Secondary | ICD-10-CM | POA: Diagnosis not present

## 2020-04-22 DIAGNOSIS — L299 Pruritus, unspecified: Secondary | ICD-10-CM | POA: Diagnosis not present

## 2020-04-22 DIAGNOSIS — L570 Actinic keratosis: Secondary | ICD-10-CM | POA: Diagnosis not present

## 2020-04-22 DIAGNOSIS — L300 Nummular dermatitis: Secondary | ICD-10-CM | POA: Diagnosis not present

## 2020-05-05 DIAGNOSIS — R5383 Other fatigue: Secondary | ICD-10-CM | POA: Diagnosis not present

## 2020-05-05 DIAGNOSIS — R059 Cough, unspecified: Secondary | ICD-10-CM | POA: Diagnosis not present

## 2020-06-04 DIAGNOSIS — K219 Gastro-esophageal reflux disease without esophagitis: Secondary | ICD-10-CM | POA: Diagnosis not present

## 2020-06-12 DIAGNOSIS — L570 Actinic keratosis: Secondary | ICD-10-CM | POA: Diagnosis not present

## 2020-06-12 DIAGNOSIS — D485 Neoplasm of uncertain behavior of skin: Secondary | ICD-10-CM | POA: Diagnosis not present

## 2020-06-30 DIAGNOSIS — D510 Vitamin B12 deficiency anemia due to intrinsic factor deficiency: Secondary | ICD-10-CM | POA: Diagnosis not present

## 2020-07-01 DIAGNOSIS — R351 Nocturia: Secondary | ICD-10-CM | POA: Diagnosis not present

## 2020-07-01 DIAGNOSIS — C61 Malignant neoplasm of prostate: Secondary | ICD-10-CM | POA: Diagnosis not present

## 2020-07-09 DIAGNOSIS — M545 Low back pain, unspecified: Secondary | ICD-10-CM | POA: Diagnosis not present

## 2020-07-23 DIAGNOSIS — N400 Enlarged prostate without lower urinary tract symptoms: Secondary | ICD-10-CM | POA: Diagnosis not present

## 2020-07-23 DIAGNOSIS — I10 Essential (primary) hypertension: Secondary | ICD-10-CM | POA: Diagnosis not present

## 2020-07-23 DIAGNOSIS — D510 Vitamin B12 deficiency anemia due to intrinsic factor deficiency: Secondary | ICD-10-CM | POA: Diagnosis not present

## 2020-07-25 DIAGNOSIS — M48062 Spinal stenosis, lumbar region with neurogenic claudication: Secondary | ICD-10-CM | POA: Diagnosis not present

## 2020-07-31 DIAGNOSIS — D510 Vitamin B12 deficiency anemia due to intrinsic factor deficiency: Secondary | ICD-10-CM | POA: Diagnosis not present

## 2020-08-08 DIAGNOSIS — M5116 Intervertebral disc disorders with radiculopathy, lumbar region: Secondary | ICD-10-CM | POA: Diagnosis not present

## 2020-08-08 DIAGNOSIS — M48062 Spinal stenosis, lumbar region with neurogenic claudication: Secondary | ICD-10-CM | POA: Diagnosis not present

## 2020-08-08 DIAGNOSIS — M5416 Radiculopathy, lumbar region: Secondary | ICD-10-CM | POA: Diagnosis not present

## 2020-08-23 DIAGNOSIS — D510 Vitamin B12 deficiency anemia due to intrinsic factor deficiency: Secondary | ICD-10-CM | POA: Diagnosis not present

## 2020-08-23 DIAGNOSIS — I10 Essential (primary) hypertension: Secondary | ICD-10-CM | POA: Diagnosis not present

## 2020-08-23 DIAGNOSIS — N400 Enlarged prostate without lower urinary tract symptoms: Secondary | ICD-10-CM | POA: Diagnosis not present

## 2020-08-25 DIAGNOSIS — Z981 Arthrodesis status: Secondary | ICD-10-CM | POA: Diagnosis not present

## 2020-08-25 DIAGNOSIS — M545 Low back pain, unspecified: Secondary | ICD-10-CM | POA: Diagnosis not present

## 2020-08-27 DIAGNOSIS — D510 Vitamin B12 deficiency anemia due to intrinsic factor deficiency: Secondary | ICD-10-CM | POA: Diagnosis not present

## 2020-09-18 DIAGNOSIS — I10 Essential (primary) hypertension: Secondary | ICD-10-CM | POA: Diagnosis not present

## 2020-09-18 DIAGNOSIS — Z6827 Body mass index (BMI) 27.0-27.9, adult: Secondary | ICD-10-CM | POA: Diagnosis not present

## 2020-09-18 DIAGNOSIS — M549 Dorsalgia, unspecified: Secondary | ICD-10-CM | POA: Diagnosis not present

## 2020-09-18 DIAGNOSIS — Z8546 Personal history of malignant neoplasm of prostate: Secondary | ICD-10-CM | POA: Diagnosis not present

## 2020-10-01 DIAGNOSIS — M5116 Intervertebral disc disorders with radiculopathy, lumbar region: Secondary | ICD-10-CM | POA: Diagnosis not present

## 2020-10-01 DIAGNOSIS — M545 Low back pain, unspecified: Secondary | ICD-10-CM | POA: Diagnosis not present

## 2020-10-01 DIAGNOSIS — M47816 Spondylosis without myelopathy or radiculopathy, lumbar region: Secondary | ICD-10-CM | POA: Diagnosis not present

## 2020-10-07 DIAGNOSIS — L821 Other seborrheic keratosis: Secondary | ICD-10-CM | POA: Diagnosis not present

## 2020-10-07 DIAGNOSIS — L570 Actinic keratosis: Secondary | ICD-10-CM | POA: Diagnosis not present

## 2020-10-26 DIAGNOSIS — R079 Chest pain, unspecified: Secondary | ICD-10-CM | POA: Diagnosis not present

## 2020-11-06 DIAGNOSIS — K219 Gastro-esophageal reflux disease without esophagitis: Secondary | ICD-10-CM | POA: Diagnosis not present

## 2020-11-06 DIAGNOSIS — D510 Vitamin B12 deficiency anemia due to intrinsic factor deficiency: Secondary | ICD-10-CM | POA: Diagnosis not present

## 2020-11-06 DIAGNOSIS — I10 Essential (primary) hypertension: Secondary | ICD-10-CM | POA: Diagnosis not present

## 2020-11-06 DIAGNOSIS — Z6827 Body mass index (BMI) 27.0-27.9, adult: Secondary | ICD-10-CM | POA: Diagnosis not present

## 2020-11-06 DIAGNOSIS — Z1331 Encounter for screening for depression: Secondary | ICD-10-CM | POA: Diagnosis not present

## 2020-11-12 DIAGNOSIS — R351 Nocturia: Secondary | ICD-10-CM | POA: Diagnosis not present

## 2020-11-12 DIAGNOSIS — C61 Malignant neoplasm of prostate: Secondary | ICD-10-CM | POA: Diagnosis not present

## 2020-11-14 DIAGNOSIS — R509 Fever, unspecified: Secondary | ICD-10-CM | POA: Diagnosis not present

## 2020-11-14 DIAGNOSIS — Z20828 Contact with and (suspected) exposure to other viral communicable diseases: Secondary | ICD-10-CM | POA: Diagnosis not present

## 2020-11-14 DIAGNOSIS — A78 Q fever: Secondary | ICD-10-CM | POA: Diagnosis not present

## 2020-11-14 DIAGNOSIS — R5081 Fever presenting with conditions classified elsewhere: Secondary | ICD-10-CM | POA: Diagnosis not present

## 2020-11-15 DIAGNOSIS — R059 Cough, unspecified: Secondary | ICD-10-CM | POA: Diagnosis not present

## 2020-11-15 DIAGNOSIS — M79605 Pain in left leg: Secondary | ICD-10-CM | POA: Diagnosis not present

## 2020-11-15 DIAGNOSIS — E876 Hypokalemia: Secondary | ICD-10-CM | POA: Diagnosis not present

## 2020-11-15 DIAGNOSIS — J3489 Other specified disorders of nose and nasal sinuses: Secondary | ICD-10-CM | POA: Diagnosis not present

## 2020-11-15 DIAGNOSIS — R6883 Chills (without fever): Secondary | ICD-10-CM | POA: Diagnosis not present

## 2020-11-15 DIAGNOSIS — Z20822 Contact with and (suspected) exposure to covid-19: Secondary | ICD-10-CM | POA: Diagnosis not present

## 2020-11-16 DIAGNOSIS — M79605 Pain in left leg: Secondary | ICD-10-CM | POA: Diagnosis not present

## 2020-11-17 DIAGNOSIS — L03116 Cellulitis of left lower limb: Secondary | ICD-10-CM | POA: Diagnosis not present

## 2020-11-20 DIAGNOSIS — L03119 Cellulitis of unspecified part of limb: Secondary | ICD-10-CM | POA: Diagnosis not present

## 2020-11-20 DIAGNOSIS — R5383 Other fatigue: Secondary | ICD-10-CM | POA: Diagnosis not present

## 2020-11-20 DIAGNOSIS — Z79899 Other long term (current) drug therapy: Secondary | ICD-10-CM | POA: Diagnosis not present

## 2020-11-20 DIAGNOSIS — Z87898 Personal history of other specified conditions: Secondary | ICD-10-CM | POA: Diagnosis not present

## 2020-11-20 DIAGNOSIS — R5381 Other malaise: Secondary | ICD-10-CM | POA: Diagnosis not present

## 2020-11-20 DIAGNOSIS — Z6827 Body mass index (BMI) 27.0-27.9, adult: Secondary | ICD-10-CM | POA: Diagnosis not present

## 2020-11-23 DIAGNOSIS — I10 Essential (primary) hypertension: Secondary | ICD-10-CM | POA: Diagnosis not present

## 2020-11-23 DIAGNOSIS — N400 Enlarged prostate without lower urinary tract symptoms: Secondary | ICD-10-CM | POA: Diagnosis not present

## 2020-11-23 DIAGNOSIS — D510 Vitamin B12 deficiency anemia due to intrinsic factor deficiency: Secondary | ICD-10-CM | POA: Diagnosis not present

## 2020-11-25 ENCOUNTER — Other Ambulatory Visit (HOSPITAL_COMMUNITY): Payer: Self-pay

## 2020-11-25 ENCOUNTER — Ambulatory Visit (INDEPENDENT_AMBULATORY_CARE_PROVIDER_SITE_OTHER): Payer: PPO | Admitting: Internal Medicine

## 2020-11-25 ENCOUNTER — Other Ambulatory Visit: Payer: Self-pay

## 2020-11-25 ENCOUNTER — Telehealth: Payer: Self-pay

## 2020-11-25 ENCOUNTER — Encounter: Payer: Self-pay | Admitting: Internal Medicine

## 2020-11-25 DIAGNOSIS — A46 Erysipelas: Secondary | ICD-10-CM

## 2020-11-25 MED ORDER — AMOXICILLIN 875 MG PO TABS: 875.0000 mg | ORAL_TABLET | Freq: Two times a day (BID) | ORAL | 0 refills | Status: DC

## 2020-11-25 NOTE — Telephone Encounter (Signed)
[facility] Patient Advocate Encounter  Completed and sent Eunice application for [facility] for this patient who is insured.    Patient assistance phone number for follow up is [phone].   This encounter will be updated until final determination.   Ileene Patrick, [facility] Patient [facility] for Infectious Disease Phone: [phone] Fax:  [phone]

## 2020-11-25 NOTE — Progress Notes (Signed)
[facility] for Infectious Disease      Reason for Consult: cellulitis   Referring Physician: Dr. Lin Landsman    Patient ID: Wesley Jackson, male    DOB: 17-May-1940, 80 y.o.   MRN: [id number]  HPI:   Here for stat evaluation of a recent diagnosis of cellulitis.  He initially presented to the ED on 11/16/20 with fever and chills with concern for COVID.  This was associated with body aches, dizziness with a dry cough and runny nose.  He also had some leg pain.  He was negative for COVID, had a doppler of his left leg and negative for DVT and treated symptomatically.  CBC, BMP wnl, no concerns except mildly decreased sodium and potassium.  He then followed up with his PCP 11/20/20 and had been put on Keflex for left leg cellulitis and was taking it three times a day for about 2 days but did not quickly resolve so changed to clindamycin.  He is now having no further fever but area on his leg remains red, warm and some tenderness.    Past Medical History:  Diagnosis Date   Arthritis    herniated disc lower back took injection 2017, occ lower back spams had epidural 2020   GERD (gastroesophageal reflux disease)    Hypertension    Prostate cancer ([place]) 2021    Prior to Admission medications   Medication Sig Start Date End Date Taking? Authorizing Provider  amLODipine (NORVASC) 5 MG tablet Take 5 mg by mouth daily. 03/04/16   [provider]  finasteride (PROSCAR) 5 MG tablet Take 5 mg by mouth daily. 12/28/19   [provider]  Meth-Hyo-M Bl-Na Phos-Ph Sal (URIBEL) 118 MG CAPS Take by mouth.    [provider]  omeprazole (PRILOSEC) 20 MG capsule Take 20 mg by mouth every morning. 03/15/16   [provider]  tadalafil (CIALIS) 5 MG tablet Take 5 mg by mouth daily.  08/14/19   [provider]  tamsulosin (FLOMAX) 0.4 MG CAPS capsule TAKE 1 CAPSULE EVERY DAY AT BEDTIME 12/24/14   [provider]    No Known Allergies  Social History    Tobacco Use   Smoking status: Never   Smokeless tobacco: Never  Vaping Use   Vaping Use: Never used  Substance Use Topics   Alcohol use: Yes    Alcohol/week: 1.0 standard drink    Types: 1 Cans of beer per week    Comment: 1 beerr month   Drug use: No    Family History  Problem Relation Age of Onset   Prostate cancer Neg Hx    Colon cancer Neg Hx    Pancreatic cancer Neg Hx    Breast cancer Neg Hx     Review of Systems  Constitutional: positive for fevers and chills or negative for anorexia Gastrointestinal: negative for nausea and diarrhea All other systems reviewed and are negative    Constitutional: in no apparent distress There were no vitals filed for this visit. EYES: anicteric ENMT: no thrush GI: Bowel sounds are normal, liver is not enlarged, spleen is not enlarged Musculoskeletal: left leg with non-pitting edema, confluent erythema of calf, down to foot, + warmth  Skin: no rash otherwise Neuro: non-focal  Labs: Lab Results  Component Value Date   WBC 5.0 12/17/2019   HGB 14.3 12/17/2019   HCT 43.3 12/17/2019   MCV 88.0 12/17/2019   PLT 208 12/17/2019    Lab Results  Component Value Date  CREATININE 0.87 12/17/2019   BUN 16 12/17/2019   NA 141 12/17/2019   K 4.2 12/17/2019   CL 105 12/17/2019   CO2 27 12/17/2019    Lab Results  Component Value Date   ALT 23 12/17/2019   AST 23 12/17/2019   ALKPHOS 89 12/17/2019   BILITOT 0.6 12/17/2019   INR 1.0 12/17/2019     Assessment: left leg rash with erythema, warmth, tenderness, confluent associated with fever and chills c/w erysipelas.  I discussed this with the patient and family, typically associated with Streptococcal organisms and cephalexin, amoxicillin with good coverage.  Clindamycin, doxycycline, with variable Strep coverage.    Plan: 1)  amoxicillin 875 mg twice a day 2) dalbavancin 1500 mg x 1 dose IV at short stay, depending on coverage Follow up in 1 week via video visit or in  person

## 2020-11-26 ENCOUNTER — Telehealth: Payer: Self-pay

## 2020-11-26 NOTE — Telephone Encounter (Signed)
[facility] Patient Advocate Encounter  Dalvance is covered by patient pharmacy benefits $1500.00. It is covered under his medical benefits through a hospital outpatient setting, patient have a $3450 deductible (patient have only met $593.85) He will have to pay 20% of his deductible which his copay will be $690.00.  Ileene Patrick, [facility] Patient [facility] for Infectious Disease Phone: [phone] Fax:  [phone]

## 2020-11-27 ENCOUNTER — Telehealth: Payer: Self-pay

## 2020-11-27 NOTE — Telephone Encounter (Signed)
I spoke to the patient and he would like to move forward with getting the dalbavancin infusion. Patient is aware of the $700 copay as well and is okay with that. Patient is scheduled with short stay for the infusion on 12/02/20. Patient aware of the appointment and location.  Wesley Jackson T Brooks Sailors

## 2020-11-27 NOTE — Telephone Encounter (Signed)
error 

## 2020-11-27 NOTE — Telephone Encounter (Signed)
-----   Message from Thayer Headings, MD sent at 11/26/2020  4:26 PM EDT ----- Please let the patient know dalbavancin would be about 700$ if he wants to proceed (see phone note) or we can wait and see how he does on amoxicillin first and consider that next week, if needed. I suspect he will improve with amoxicillin though.  thanks

## 2020-12-01 ENCOUNTER — Other Ambulatory Visit (HOSPITAL_COMMUNITY): Payer: Self-pay | Admitting: *Deleted

## 2020-12-02 ENCOUNTER — Ambulatory Visit (INDEPENDENT_AMBULATORY_CARE_PROVIDER_SITE_OTHER): Payer: PPO | Admitting: Internal Medicine

## 2020-12-02 ENCOUNTER — Encounter: Payer: Self-pay | Admitting: Internal Medicine

## 2020-12-02 ENCOUNTER — Ambulatory Visit (HOSPITAL_COMMUNITY)
Admission: RE | Admit: 2020-12-02 | Discharge: 2020-12-02 | Disposition: A | Discharge status: Home / Self Care | Payer: PPO | Source: Ambulatory Visit | Attending: Internal Medicine | Admitting: Internal Medicine

## 2020-12-02 ENCOUNTER — Other Ambulatory Visit: Payer: Self-pay

## 2020-12-02 ENCOUNTER — Telehealth: Payer: Self-pay

## 2020-12-02 DIAGNOSIS — L089 Local infection of the skin and subcutaneous tissue, unspecified: Secondary | ICD-10-CM | POA: Diagnosis not present

## 2020-12-02 DIAGNOSIS — A46 Erysipelas: Secondary | ICD-10-CM | POA: Diagnosis not present

## 2020-12-02 DIAGNOSIS — I96 Gangrene, not elsewhere classified: Secondary | ICD-10-CM | POA: Insufficient documentation

## 2020-12-02 MED ORDER — DEXTROSE 5 % IV SOLN
1500.0000 mg | Freq: Once | INTRAVENOUS | Status: AC
  Administered 2020-12-02: 1500 mg via INTRAVENOUS
  Filled 2020-12-02: qty 75

## 2020-12-02 NOTE — Assessment & Plan Note (Signed)
Much improved after oral amoxicillin.  No new concerns and he has also received a dose of dalbavancin which will remain in his system about 2 weeks.  No further antibiotics indicated after that rtc as needed

## 2020-12-02 NOTE — Telephone Encounter (Signed)
-----   Message from Thayer Headings, MD sent at 12/02/2020 11:06 AM EDT ----- Can you just overbook him today with me for after his infusion since he will be out here this afternoon?  I just want to take a quick look at his leg, see how he is doing.  Thanks ----- Message ----- From: Allegra Grana, CMA Sent: 11/27/2020   8:43 AM EDT To: Thayer Headings, MD  Patient states he would like to start the dalbavancin. Patient also aware of the pricing. I also see you want the patient to follow up next week. You are completely booked next week. Can we push him out another week? ----- Message ----- From: Thayer Headings, MD Sent: 11/26/2020   4:27 PM EDT To: [facility] Triage Nurse Pool  Please let the patient know dalbavancin would be about 700$ if he wants to proceed (see phone note) or we can wait and see how he does on amoxicillin first and consider that next week, if needed. I suspect he will improve with amoxicillin though.  thanks

## 2020-12-02 NOTE — Assessment & Plan Note (Signed)
Some expected sloughing from edema.  Discussed with him continued leg elevation

## 2020-12-02 NOTE — Progress Notes (Signed)
   Subjective:    Patient ID: Wesley Jackson, male    DOB: 04-03-1941, 80 y.o.   MRN: [id number]  HPI He is here for follow up of erysipelas. I saw him as a new patient for about a week of left leg pain and swelling. Leg was consistent with erysipelas.  He had been on clindamycin with no improvement, though his fever had resolved.  I started him on oral amoxicillin and also attempted to get IV dalbavancin infusion which he was able to get today.     Review of Systems  Constitutional:  Negative for chills and fever.  Gastrointestinal:  Negative for diarrhea and nausea.      Objective:   Physical Exam Eyes:     General: No scleral icterus. Pulmonary:     Effort: Pulmonary effort is normal.  Skin:    Comments: Left leg with less pitting edema.  No erythema now, no significant warmth.  Some sloughing of his skin, no tenderness  Neurological:     Mental Status: He is alert.          Assessment & Plan:

## 2020-12-02 NOTE — Telephone Encounter (Signed)
Spoke with patient's wife and patient is scheduled for 3:00 pm today to follow up. Patient will get here sooner if he is able to.  Wesley Jackson T Brooks Sailors

## 2021-01-01 DIAGNOSIS — D510 Vitamin B12 deficiency anemia due to intrinsic factor deficiency: Secondary | ICD-10-CM | POA: Diagnosis not present

## 2021-02-03 DIAGNOSIS — K219 Gastro-esophageal reflux disease without esophagitis: Secondary | ICD-10-CM | POA: Diagnosis not present

## 2021-02-03 DIAGNOSIS — R490 Dysphonia: Secondary | ICD-10-CM | POA: Diagnosis not present

## 2021-02-03 DIAGNOSIS — Z6827 Body mass index (BMI) 27.0-27.9, adult: Secondary | ICD-10-CM | POA: Diagnosis not present

## 2021-02-03 DIAGNOSIS — I872 Venous insufficiency (chronic) (peripheral): Secondary | ICD-10-CM | POA: Diagnosis not present

## 2021-02-03 DIAGNOSIS — J302 Other seasonal allergic rhinitis: Secondary | ICD-10-CM | POA: Diagnosis not present

## 2021-02-03 DIAGNOSIS — Z23 Encounter for immunization: Secondary | ICD-10-CM | POA: Diagnosis not present

## 2021-02-03 DIAGNOSIS — D510 Vitamin B12 deficiency anemia due to intrinsic factor deficiency: Secondary | ICD-10-CM | POA: Diagnosis not present

## 2021-02-17 DIAGNOSIS — J302 Other seasonal allergic rhinitis: Secondary | ICD-10-CM | POA: Diagnosis not present

## 2021-02-17 DIAGNOSIS — Z6827 Body mass index (BMI) 27.0-27.9, adult: Secondary | ICD-10-CM | POA: Diagnosis not present

## 2021-02-17 DIAGNOSIS — J40 Bronchitis, not specified as acute or chronic: Secondary | ICD-10-CM | POA: Diagnosis not present

## 2021-02-17 DIAGNOSIS — Z20828 Contact with and (suspected) exposure to other viral communicable diseases: Secondary | ICD-10-CM | POA: Diagnosis not present

## 2021-02-17 DIAGNOSIS — B370 Candidal stomatitis: Secondary | ICD-10-CM | POA: Diagnosis not present

## 2021-02-17 DIAGNOSIS — R490 Dysphonia: Secondary | ICD-10-CM | POA: Diagnosis not present

## 2021-02-17 DIAGNOSIS — J329 Chronic sinusitis, unspecified: Secondary | ICD-10-CM | POA: Diagnosis not present

## 2021-03-02 DIAGNOSIS — J387 Other diseases of larynx: Secondary | ICD-10-CM | POA: Diagnosis not present

## 2021-03-02 DIAGNOSIS — H6123 Impacted cerumen, bilateral: Secondary | ICD-10-CM | POA: Diagnosis not present

## 2021-03-02 DIAGNOSIS — J342 Deviated nasal septum: Secondary | ICD-10-CM | POA: Diagnosis not present

## 2021-03-02 DIAGNOSIS — K219 Gastro-esophageal reflux disease without esophagitis: Secondary | ICD-10-CM | POA: Diagnosis not present

## 2021-03-02 DIAGNOSIS — Z9889 Other specified postprocedural states: Secondary | ICD-10-CM | POA: Diagnosis not present

## 2021-03-02 DIAGNOSIS — R0989 Other specified symptoms and signs involving the circulatory and respiratory systems: Secondary | ICD-10-CM | POA: Diagnosis not present

## 2021-03-02 DIAGNOSIS — R490 Dysphonia: Secondary | ICD-10-CM | POA: Diagnosis not present

## 2021-03-02 DIAGNOSIS — Z85828 Personal history of other malignant neoplasm of skin: Secondary | ICD-10-CM | POA: Diagnosis not present

## 2021-03-02 DIAGNOSIS — Z8546 Personal history of malignant neoplasm of prostate: Secondary | ICD-10-CM | POA: Diagnosis not present

## 2021-04-07 DIAGNOSIS — L570 Actinic keratosis: Secondary | ICD-10-CM | POA: Diagnosis not present

## 2021-05-21 DIAGNOSIS — C61 Malignant neoplasm of prostate: Secondary | ICD-10-CM | POA: Diagnosis not present

## 2021-05-21 DIAGNOSIS — R351 Nocturia: Secondary | ICD-10-CM | POA: Diagnosis not present

## 2021-06-01 DIAGNOSIS — D510 Vitamin B12 deficiency anemia due to intrinsic factor deficiency: Secondary | ICD-10-CM | POA: Diagnosis not present

## 2021-07-09 DIAGNOSIS — R2242 Localized swelling, mass and lump, left lower limb: Secondary | ICD-10-CM | POA: Diagnosis not present

## 2021-07-09 DIAGNOSIS — D510 Vitamin B12 deficiency anemia due to intrinsic factor deficiency: Secondary | ICD-10-CM | POA: Diagnosis not present

## 2021-07-20 DIAGNOSIS — L578 Other skin changes due to chronic exposure to nonionizing radiation: Secondary | ICD-10-CM | POA: Diagnosis not present

## 2021-07-20 DIAGNOSIS — L821 Other seborrheic keratosis: Secondary | ICD-10-CM | POA: Diagnosis not present

## 2021-07-20 DIAGNOSIS — L570 Actinic keratosis: Secondary | ICD-10-CM | POA: Diagnosis not present

## 2021-08-07 DIAGNOSIS — R400 Somnolence: Secondary | ICD-10-CM | POA: Diagnosis not present

## 2021-08-07 DIAGNOSIS — J302 Other seasonal allergic rhinitis: Secondary | ICD-10-CM | POA: Diagnosis not present

## 2021-08-07 DIAGNOSIS — J310 Chronic rhinitis: Secondary | ICD-10-CM | POA: Diagnosis not present

## 2021-08-07 DIAGNOSIS — R600 Localized edema: Secondary | ICD-10-CM | POA: Diagnosis not present

## 2021-08-07 DIAGNOSIS — I10 Essential (primary) hypertension: Secondary | ICD-10-CM | POA: Diagnosis not present

## 2021-08-07 DIAGNOSIS — R5383 Other fatigue: Secondary | ICD-10-CM | POA: Diagnosis not present

## 2021-08-07 DIAGNOSIS — Z6827 Body mass index (BMI) 27.0-27.9, adult: Secondary | ICD-10-CM | POA: Diagnosis not present

## 2021-08-07 DIAGNOSIS — T485X5A Adverse effect of other anti-common-cold drugs, initial encounter: Secondary | ICD-10-CM | POA: Diagnosis not present

## 2021-08-07 DIAGNOSIS — D510 Vitamin B12 deficiency anemia due to intrinsic factor deficiency: Secondary | ICD-10-CM | POA: Diagnosis not present

## 2021-08-19 DIAGNOSIS — J209 Acute bronchitis, unspecified: Secondary | ICD-10-CM | POA: Diagnosis not present

## 2021-08-19 DIAGNOSIS — Z20828 Contact with and (suspected) exposure to other viral communicable diseases: Secondary | ICD-10-CM | POA: Diagnosis not present

## 2021-08-28 DIAGNOSIS — D510 Vitamin B12 deficiency anemia due to intrinsic factor deficiency: Secondary | ICD-10-CM | POA: Diagnosis not present

## 2021-09-03 DIAGNOSIS — R5383 Other fatigue: Secondary | ICD-10-CM | POA: Diagnosis not present

## 2021-09-03 DIAGNOSIS — I10 Essential (primary) hypertension: Secondary | ICD-10-CM | POA: Diagnosis not present

## 2021-09-03 DIAGNOSIS — N400 Enlarged prostate without lower urinary tract symptoms: Secondary | ICD-10-CM | POA: Diagnosis not present

## 2021-09-03 DIAGNOSIS — Z6827 Body mass index (BMI) 27.0-27.9, adult: Secondary | ICD-10-CM | POA: Diagnosis not present

## 2021-09-03 DIAGNOSIS — Z0000 Encounter for general adult medical examination without abnormal findings: Secondary | ICD-10-CM | POA: Diagnosis not present

## 2021-09-22 ENCOUNTER — Ambulatory Visit (INDEPENDENT_AMBULATORY_CARE_PROVIDER_SITE_OTHER): Payer: PPO | Admitting: Cardiology

## 2021-09-22 ENCOUNTER — Telehealth: Payer: Self-pay

## 2021-09-22 ENCOUNTER — Telehealth: Payer: Self-pay | Admitting: Cardiology

## 2021-09-22 ENCOUNTER — Encounter: Payer: Self-pay | Admitting: Cardiology

## 2021-09-22 VITALS — BP 116/58 | HR 69 | Ht 69.0 in | Wt 182.2 lb

## 2021-09-22 DIAGNOSIS — R0789 Other chest pain: Secondary | ICD-10-CM

## 2021-09-22 DIAGNOSIS — R0609 Other forms of dyspnea: Secondary | ICD-10-CM

## 2021-09-22 DIAGNOSIS — E785 Hyperlipidemia, unspecified: Secondary | ICD-10-CM

## 2021-09-22 DIAGNOSIS — R072 Precordial pain: Secondary | ICD-10-CM | POA: Diagnosis not present

## 2021-09-22 DIAGNOSIS — C61 Malignant neoplasm of prostate: Secondary | ICD-10-CM

## 2021-09-22 HISTORY — DX: Other forms of dyspnea: R06.09

## 2021-09-22 HISTORY — DX: Other chest pain: R07.89

## 2021-09-22 HISTORY — DX: Hyperlipidemia, unspecified: E78.5

## 2021-09-22 MED ORDER — ASPIRIN 81 MG PO TBEC: 81.0000 mg | DELAYED_RELEASE_TABLET | Freq: Every day | ORAL | 3 refills | Status: AC

## 2021-09-22 NOTE — Telephone Encounter (Signed)
Pt would like for nurse to give him a callback regarding AVS and what was discussed at visit today.Please advise

## 2021-09-22 NOTE — Progress Notes (Unsigned)
Cardiology Consultation:    Date:  09/22/2021   ID:  Wesley Jackson, DOB June 29, 1940, MRN [id number]  PCP:  Angelina Sheriff, MD  Cardiologist:  Jenne Campus, MD   Referring MD: Angelina Sheriff, MD   Chief Complaint  Patient presents with   Shortness of Breath        Dizziness   Fatigue    Ongoing for months     History of Present Illness:    Wesley Jackson is a 81 y.o. male who is being seen today for the evaluation of shortness of breath at the request of Angelina Sheriff, MD. past medical history significant prostate CA, dyslipidemia.  Months ago so he ended up having COVID-19 infection.  Since that time he is being profoundly weak tired fatigue also short of breath.  He used to play golf on the regular basis however now he cannot do it because of above-mentioned symptoms he never had any heart trouble.  However he does have family history significant for premature coronary artery disease.  His father apparently had myocardial infarction before age of 52.  He does not smoke never did active playing golf walking with no difficulty but now since the COVID-19 difficulties. Does not take cholesterol medication He is not on any special diet   Past Medical History:  Diagnosis Date   Arthritis    herniated disc lower back took injection 2017, occ lower back spams had epidural 2020   GERD (gastroesophageal reflux disease)    Hypertension    Prostate cancer ([place]) 2021    Past Surgical History:  Procedure Laterality Date   ACHILLES TENDON REPAIR Left yrs ago   ARTHROSCOPY KNEE W/ DRILLING Right yrs ago   CYSTOSCOPY N/A 12/20/2019   Procedure: CYSTOSCOPY FLEXIBLE;  Surgeon: Joie Bimler, MD;  Location: [facility];  Service: Urology;  Laterality: N/A;   EYE SURGERY Bilateral 2019   cataracts both eyes   HERNIA REPAIR Bilateral yrs ago   inguinal hernia    NASAL TURBINATE REDUCTION  05/2019   PROSTATE BIOPSY  2021   RADIOACTIVE SEED IMPLANT N/A  12/20/2019   Procedure: RADIOACTIVE SEED IMPLANT/BRACHYTHERAPY IMPLANT;  Surgeon: Joie Bimler, MD;  Location: [facility];  Service: Urology;  Laterality: N/A;   skin biopsies, nose  08/2019   SPACE OAR INSTILLATION N/A 12/20/2019   Procedure: SPACE OAR INSTILLATION;  Surgeon: Joie Bimler, MD;  [facility];  Service: Urology;  Laterality: N/A;    Current Medications: Current Meds  Medication Sig   cloNIDine (CATAPRES) 0.1 MG tablet Take 0.1 mg by mouth 2 (two) times daily.   fexofenadine-pseudoephedrine (ALLEGRA-D 24) 180-240 MG 24 hr tablet Take 1 tablet by mouth as needed (Allergies).   finasteride (PROSCAR) 5 MG tablet Take 5 mg by mouth daily.   Multiple Vitamins-Minerals (CENTRUM SILVER ADULT 50+ PO) Take 1 tablet by mouth every other day.   olmesartan (BENICAR) 20 MG tablet Take 20 mg by mouth daily.   omeprazole (PRILOSEC) 20 MG capsule Take 20 mg by mouth every morning.   Probiotic Product (PROBIOTIC GUMMIES PO) Take 1 capsule by mouth every other day.   tadalafil (CIALIS) 5 MG tablet Take 5 mg by mouth daily as needed for erectile dysfunction. ED   tamsulosin (FLOMAX) 0.4 MG CAPS capsule Take 0.4 mg by mouth daily after supper.   [DISCONTINUED] amLODipine (NORVASC) 5 MG tablet Take 5 mg by mouth daily.   [DISCONTINUED] amoxicillin (  AMOXIL) 875 MG tablet Take 1 tablet (875 mg total) by mouth 2 (two) times daily.   [DISCONTINUED] Meth-Hyo-M Bl-Na Phos-Ph Sal (URIBEL) 118 MG CAPS Take 1 capsule by mouth daily at 6 (six) AM.     Allergies:   Patient has no known allergies.   Social History   Socioeconomic History   Marital status: Married    Spouse name: Not on file   Number of children: Not on file   Years of education: Not on file   Highest education level: Not on file  Occupational History   Not on file  Tobacco Use   Smoking status: Never   Smokeless tobacco: Never  Vaping Use   Vaping Use: Never used  Substance and Sexual  Activity   Alcohol use: Yes    Alcohol/week: 1.0 standard drink    Types: 1 Cans of beer per week    Comment: 1 beerr month   Drug use: No   Sexual activity: Not Currently  Other Topics Concern   Not on file  Social History Narrative   Not on file   Social Determinants of Health   Financial Resource Strain: Not on file  Food Insecurity: Not on file  Transportation Needs: Not on file  Physical Activity: Not on file  Stress: Not on file  Social Connections: Not on file     Family History: The patient's family history includes COPD in his mother; Diabetes in his father; Heart attack in his father. There is no history of Prostate cancer, Colon cancer, Pancreatic cancer, or Breast cancer. ROS:   Please see the history of present illness.    All 14 point review of systems negative except as described per history of present illness.  EKGs/Labs/Other Studies Reviewed:    The following studies were reviewed today:   EKG:  EKG is  ordered today.  The ekg ordered today demonstrates normal sinus rhythm left axis deviation no ST segment changes  Recent Labs: No results found for requested labs within last 8760 hours.  Recent Lipid Panel No results found for: CHOL, TRIG, HDL, CHOLHDL, VLDL, LDLCALC, LDLDIRECT  Physical Exam:    VS:  BP (!) 116/58 (BP Location: Left Arm, Patient Position: Sitting)   Pulse 69   Ht '5\' 9"'$  (1.753 m)   Wt 182 lb 3.2 oz (82.6 kg)   SpO2 93%   BMI 26.91 kg/m     Wt Readings from Last 3 Encounters:  09/22/21 182 lb 3.2 oz (82.6 kg)  12/02/20 186 lb 9.6 oz (84.6 kg)  11/25/20 189 lb (85.7 kg)     GEN:  Well nourished, well developed in no acute distress HEENT: Normal NECK: No JVD; No carotid bruits LYMPHATICS: No lymphadenopathy CARDIAC: RRR, no murmurs, no rubs, no gallops RESPIRATORY:  Clear to auscultation without rales, wheezing or rhonchi  ABDOMEN: Soft, non-tender, non-distended MUSCULOSKELETAL:  No edema; No deformity  SKIN: Warm and  dry NEUROLOGIC:  Alert and oriented x 3 PSYCHIATRIC:  Normal affect   ASSESSMENT:    1. Malignant neoplasm of prostate ([place])   2. Dyspnea on exertion   3. Atypical chest pain   4. Dyslipidemia    PLAN:    In order of problems listed above:  Dyspnea on exertion after COVID.  We do see this quite often luckily usually people recover from this with no consequences.  However, I need to check to make sure there is no issue with his heart I will schedule him to have echocardiogram to  assess left ventricle ejection fraction. Atypical chest pain.  Again shortness of breath associated with this.  I will schedule him to have coronary CT angio to make sure he does not have any significant coronary artery disease.  That will also help [place] to look at his lungs make sure there is no organic lung problem related to COVID.  I asked him to start taking aspirin daily Dyslipidemia I will not initiate any cholesterol medication until have better understanding about coronary artery status Prostate cancer apparently stable   Medication Adjustments/Labs and Tests Ordered: Current medicines are reviewed at length with the patient today.  Concerns regarding medicines are outlined above.  No orders of the defined types were placed in this encounter.  No orders of the defined types were placed in this encounter.   Signed, Park Liter, MD, [facility]. 09/22/2021 10:46 AM    [facility]

## 2021-09-22 NOTE — Patient Instructions (Signed)
Medication Instructions:  Metoprolol tartrate 100 mg 1 tablet one time- 2 hours prior to CT scan START: Aspirin - Enteric Coated- '81mg'$  1 tablet by mouth daily  *If you need a refill on your cardiac medications before your next appointment, please call your pharmacy*   Lab Work: BMP - 1 week prior to CT Scan  If you have labs (blood work) drawn today and your tests are completely normal, you will receive your results only by: [place] (if you have MyChart) OR A paper copy in the mail If you have any lab test that is abnormal or we need to change your treatment, we will call you to review the results.   Testing/Procedures:   Your cardiac CT will be scheduled at one of the below locations:   [facility] [address] [place], [place] [address] [phone]   At [facility], please arrive at the [facility] (Entrance C2) of [facility] 30 minutes prior to test start time. You can use the FREE valet parking offered at entrance C (encouraged to control the heart rate for the test)  Proceed to the [facility] Department (first floor) to check-in and test prep.  All radiology patients and guests should use entrance C2 at [facility], accessed from [facility], even though the hospital's physical address listed is [address]      Please follow these instructions carefully (unless otherwise directed):  Hold all erectile dysfunction medications at least 3 days (72 hrs) prior to test.  On the Night Before the Test: Be sure to Drink plenty of water. Do not consume any caffeinated/decaffeinated beverages or chocolate 12 hours prior to your test. Do not take any antihistamines 12 hours prior to your test.   On the Day of the Test: Drink plenty of water until 1 hour prior to the test. Do not eat any food 4 hours prior to the test. You may take your regular medications prior to the  test.  Take metoprolol (Lopressor) two hours prior to test.         After the Test: Drink plenty of water. After receiving IV contrast, you may experience a mild flushed feeling. This is normal. On occasion, you may experience a mild rash up to 24 hours after the test. This is not dangerous. If this occurs, you can take Benadryl 25 mg and increase your fluid intake. If you experience trouble breathing, this can be serious. If it is severe call 911 IMMEDIATELY. If it is mild, please call our office. If you take any of these medications: Glipizide/Metformin, Avandament, Glucavance, please do not take 48 hours after completing test unless otherwise instructed.  We will call to schedule your test 2-4 weeks out understanding that some insurance companies will need an authorization prior to the service being performed.   For non-scheduling related questions, please contact the cardiac imaging nurse navigator should you have any questions/concerns: Marchia Bond, Cardiac Imaging Nurse Navigator Gordy Clement, Cardiac Imaging Nurse Navigator [place] Direct Office Dial: [phone]   For scheduling needs, including cancellations and rescheduling, please call [place], [phone].   Your physician has requested that you have an echocardiogram. Echocardiography is a painless test that uses sound waves to create images of your heart. It provides your doctor with information about the size and shape of your heart and how well your heart's chambers and valves are working. This procedure takes approximately one  hour. There are no restrictions for this procedure.    Follow-Up: At [facility], you and your health needs are our priority.  As part of our continuing mission to provide you with exceptional heart care, we have created designated Provider Care Teams.  These Care Teams include your primary Cardiologist (physician) and Advanced Practice Providers (APPs -   Physician Assistants and Nurse Practitioners) who all work together to provide you with the care you need, when you need it.  We recommend signing up for the patient portal called "MyChart".  Sign up information is provided on this After Visit Summary.  MyChart is used to connect with patients for Virtual Visits (Telemedicine).  Patients are able to view lab/test results, encounter notes, upcoming appointments, etc.  Non-urgent messages can be sent to your provider as well.   To learn more about what you can do with MyChart, go to [email or web address].    Your next appointment:   2 month(s)  The format for your next appointment:   In Person  Provider:   Jenne Campus, MD   Other Instructions Cardiac CT Angiogram A cardiac CT angiogram is a procedure to look at the heart and the area around the heart. It may be done to help find the cause of chest pains or other symptoms of heart disease. During this procedure, a substance called contrast dye is injected into the blood vessels in the area to be checked. A large X-ray machine, called a CT scanner, then takes detailed pictures of the heart and the surrounding area. The procedure is also sometimes called a coronary CT angiogram, coronary artery scanning, or CTA. A cardiac CT angiogram allows the health care provider to see how well blood is flowing to and from the heart. The health care provider will be able to see if there are any problems, such as: Blockage or narrowing of the coronary arteries in the heart. Fluid around the heart. Signs of weakness or disease in the muscles, valves, and tissues of the heart. Tell a health care provider about: Any allergies you have. This is especially important if you have had a previous allergic reaction to contrast dye. All medicines you are taking, including vitamins, herbs, eye drops, creams, and over-the-counter medicines. Any blood disorders you have. Any surgeries you have had. Any medical  conditions you have. Whether you are pregnant or may be pregnant. Any anxiety disorders, chronic pain, or other conditions you have that may increase your stress or prevent you from lying still. What are the risks? Generally, this is a safe procedure. However, problems may occur, including: Bleeding. Infection. Allergic reactions to medicines or dyes. Damage to other structures or organs. Kidney damage from the contrast dye that is used. Increased risk of cancer from radiation exposure. This risk is low. Talk with your health care provider about: The risks and benefits of testing. How you can receive the lowest dose of radiation. What happens before the procedure? Wear comfortable clothing and remove any jewelry, glasses, dentures, and hearing aids. Follow instructions from your health care provider about eating and drinking. This may include: For 12 hours before the procedure -- avoid caffeine. This includes tea, coffee, soda, energy drinks, and diet pills. Drink plenty of water or other fluids that do not have caffeine in them. Being well hydrated can prevent complications. For 4-6 hours before the procedure -- stop eating and drinking. The contrast dye can cause nausea, but this is less likely if your stomach is empty. Ask your health  care provider about changing or stopping your regular medicines. This is especially important if you are taking diabetes medicines, blood thinners, or medicines to treat problems with erections (erectile dysfunction). What happens during the procedure?  Hair on your chest may need to be removed so that small sticky patches called electrodes can be placed on your chest. These will transmit information that helps to monitor your heart during the procedure. An IV will be inserted into one of your veins. You might be given a medicine to control your heart rate during the procedure. This will help to ensure that good images are obtained. You will be asked to lie  on an exam table. This table will slide in and out of the CT machine during the procedure. Contrast dye will be injected into the IV. You might feel warm, or you may get a metallic taste in your mouth. You will be given a medicine called nitroglycerin. This will relax or dilate the arteries in your heart. The table that you are lying on will move into the CT machine tunnel for the scan. The person running the machine will give you instructions while the scans are being done. You may be asked to: Keep your arms above your head. Hold your breath. Stay very still, even if the table is moving. When the scanning is complete, you will be moved out of the machine. The IV will be removed. The procedure may vary among health care providers and hospitals. What can I expect after the procedure? After your procedure, it is common to have: A metallic taste in your mouth from the contrast dye. A feeling of warmth. A headache from the nitroglycerin. Follow these instructions at home: Take over-the-counter and prescription medicines only as told by your health care provider. If you are told, drink enough fluid to keep your urine pale yellow. This will help to flush the contrast dye out of your body. Most people can return to their normal activities right after the procedure. Ask your health care provider what activities are safe for you. It is up to you to get the results of your procedure. Ask your health care provider, or the department that is doing the procedure, when your results will be ready. Keep all follow-up visits as told by your health care provider. This is important. Contact a health care provider if: You have any symptoms of allergy to the contrast dye. These include: Shortness of breath. Rash or hives. A racing heartbeat. Summary A cardiac CT angiogram is a procedure to look at the heart and the area around the heart. It may be done to help find the cause of chest pains or other symptoms  of heart disease. During this procedure, a large X-ray machine, called a CT scanner, takes detailed pictures of the heart and the surrounding area after a contrast dye has been injected into blood vessels in the area. Ask your health care provider about changing or stopping your regular medicines before the procedure. This is especially important if you are taking diabetes medicines, blood thinners, or medicines to treat erectile dysfunction. If you are told, drink enough fluid to keep your urine pale yellow. This will help to flush the contrast dye out of your body. This information is not intended to replace advice given to you by your health care provider. Make sure you discuss any questions you have with your health care provider. Document Revised: 12/06/2018 Document Reviewed: 12/06/2018 Elsevier Patient Education  [place].

## 2021-09-22 NOTE — Telephone Encounter (Signed)
Pt had questions regarding AVS. He wanted to know why the prostate cancer diagnosis was mentioned as discussed. Advised pt that previous diagnosis will come up on the AVS as well as appointments. He stated that he just wanted to make sure there was nothing new that he did not hear regarding the prostate cancer. He verbalized understanding and had no further questions.

## 2021-09-28 ENCOUNTER — Ambulatory Visit (INDEPENDENT_AMBULATORY_CARE_PROVIDER_SITE_OTHER): Payer: PPO

## 2021-09-28 DIAGNOSIS — R0609 Other forms of dyspnea: Secondary | ICD-10-CM | POA: Diagnosis not present

## 2021-09-28 LAB — ECHOCARDIOGRAM COMPLETE
Area-P 1/2: 3.24 cm2
MV M vel: 5.69 m/s
MV Peak grad: 129.5 mmHg
Radius: 0.6 cm
S' Lateral: 3.15 cm

## 2021-09-29 DIAGNOSIS — R0789 Other chest pain: Secondary | ICD-10-CM | POA: Diagnosis not present

## 2021-09-29 DIAGNOSIS — D510 Vitamin B12 deficiency anemia due to intrinsic factor deficiency: Secondary | ICD-10-CM | POA: Diagnosis not present

## 2021-09-29 DIAGNOSIS — R0609 Other forms of dyspnea: Secondary | ICD-10-CM | POA: Diagnosis not present

## 2021-09-30 LAB — BASIC METABOLIC PANEL
BUN/Creatinine Ratio: 10 (ref 10–24)
BUN: 8 mg/dL (ref 8–27)
CO2: 26 mmol/L (ref 20–29)
Calcium: 9.2 mg/dL (ref 8.6–10.2)
Chloride: 104 mmol/L (ref 96–106)
Creatinine, Ser: 0.83 mg/dL (ref 0.76–1.27)
Glucose: 130 mg/dL — ABNORMAL HIGH (ref 70–99)
Potassium: 4.2 mmol/L (ref 3.5–5.2)
Sodium: 142 mmol/L (ref 134–144)
eGFR: 89 mL/min/{1.73_m2} (ref >59)

## 2021-10-05 ENCOUNTER — Telehealth: Payer: Self-pay

## 2021-10-05 NOTE — Telephone Encounter (Signed)
Pt wife called to see if her husband get an repeat injection.

## 2021-10-06 ENCOUNTER — Encounter: Payer: Self-pay | Admitting: Physical Medicine and Rehabilitation

## 2021-10-06 ENCOUNTER — Ambulatory Visit (INDEPENDENT_AMBULATORY_CARE_PROVIDER_SITE_OTHER): Payer: PPO | Admitting: Physical Medicine and Rehabilitation

## 2021-10-06 VITALS — BP 106/46 | HR 74

## 2021-10-06 DIAGNOSIS — M545 Low back pain, unspecified: Secondary | ICD-10-CM | POA: Diagnosis not present

## 2021-10-06 DIAGNOSIS — M47816 Spondylosis without myelopathy or radiculopathy, lumbar region: Secondary | ICD-10-CM | POA: Diagnosis not present

## 2021-10-06 DIAGNOSIS — G8929 Other chronic pain: Secondary | ICD-10-CM

## 2021-10-06 DIAGNOSIS — L570 Actinic keratosis: Secondary | ICD-10-CM | POA: Diagnosis not present

## 2021-10-06 NOTE — Progress Notes (Unsigned)
Pt state lower back pain. Pt state sitting and bending makes the pain worse. Pt state he uses ice, heat and takes over the counter pain meds to help ease his pain.  Numeric Pain Rating Scale and Functional Assessment Average Pain 9 Pain Right Now 3 My pain is intermittent, sharp, burning, dull, and stabbing Pain is worse with: bending, sitting, and some activites Pain improves with: heat/ice, medication, and injections   In the last MONTH (on 0-10 scale) has pain interfered with the following?  1. General activity like being  able to carry out your everyday physical activities such as walking, climbing stairs, carrying groceries, or moving a chair?  Rating(5)  2. Relation with others like being able to carry out your usual social activities and roles such as  activities at home, at work and in your community. Rating(6)  3. Enjoyment of life such that you have  been bothered by emotional problems such as feeling anxious, depressed or irritable?  Rating(7)

## 2021-10-06 NOTE — Progress Notes (Unsigned)
Wesley Jackson - 81 y.o. male MRN [id number]  Date of birth: Apr 17, 1941  Office Visit Note: Visit Date: 10/06/2021 PCP: Angelina Sheriff, MD Referred by: Angelina Sheriff, MD  Subjective: Chief Complaint  Patient presents with   Lower Back - Pain   HPI: Wesley Jackson is a 81 y.o. male who comes in today for evaluation of chronic, worsening and severe left sided lower back pain. Patient was last seen in our office in 2020. Patient reports chronic issue with same for several years, pain has increased over the last couple of weeks. He reports pain is exacerbated by moving from sitting to standing position, bending and twisting, describes pain as a sore and aching sensation, currently rates as 9 out of 10. Patient reports some relief of pain with home exercise regimen, use of heating pad/ice pack, rest and Aleve as needed. Patient has attended formal physical therapy in the past, feels these treatments were not beneficial in alleviating pain. Patient states he tries to stay active and did play golf recently for the first time in several weeks. Patients lumbar MRI from 2020 exhibits multi-level facet hypertrophy, stable right foraminal and extraforaminal disc protrusion at L4-5 with mild displacement of the right L4 nerve root. No high grade spinal canal stenosis noted. Patient has had multiple lumbar injections performed in our office over the last several years that include facet joint injections and transforaminal epidural steroid injection at the levels of L3 and L4. Patient reports significant pain relief with injections. Patient had surgical consultation with Dr. Jenne Campus at [facility] in 2020. Per Dr. Nelly Laurence notes there was no discussion of surgery at that time, however he did refer patient Dr. Hulan Saas with neurosurgery for right L3 transforaminal epidural steroid injection, patient reports this procedure did help to alleviate pain. Patient states his  severe pain is negatively impacting his daily life. Patient states he had COVID this past April and continues to struggle with fatigue and low energy level, he does have plans to resume golf if able. Patient denies focal weakness, numbness and tingling. Patient denies recent trauma or falls.    Review of Systems  Musculoskeletal:  Positive for back pain.  Neurological:  Negative for tingling, sensory change, focal weakness and weakness.  All other systems reviewed and are negative.  Otherwise per HPI.  Assessment & Plan: Visit Diagnoses:    ICD-10-CM   1. Chronic bilateral low back pain without sciatica  M54.50 Ambulatory referral to Physical Medicine Rehab   G89.29     2. Spondylosis without myelopathy or radiculopathy, lumbar region  M47.816 Ambulatory referral to Physical Medicine Rehab    3. Facet hypertrophy of lumbar region  M47.816 Ambulatory referral to Physical Medicine Rehab       Plan: Findings:  Chronic, worsening and severe left-sided axial back pain.  No radicular symptoms at this time.  Patient continues to have severe pain despite good conservative therapy such as formal physical therapy, home exercise regimen, heating pad/ice pack, and medications.  Patient's clinical presentation and exam are consistent with facet mediated pain.  He does have severe pain with lumbar extension today.  We believe the next step is to repeat left L3-L4 and L4-L5 intra-articular facet joint injections under fluoroscopy. If patient continues to get sustained pain relief with facet joint injections we can repeat this infrequently as needed. If his pain persists post injection we would consider obtaining new lumbar MRI imaging. Patient encouraged to remain  active and to continue with home exercise regimen as tolerated. No red flag symptoms noted upon exam today.     Meds & Orders: No orders of the defined types were placed in this encounter.   Orders Placed This Encounter  Procedures    Ambulatory referral to Physical Medicine Rehab    Follow-up: Return for Left L3-L4 and L4-L5 facet joint injections.   Procedures: No procedures performed      Clinical History: EXAM: MRI LUMBAR SPINE WITHOUT CONTRAST   TECHNIQUE: Multiplanar, multisequence MR imaging of the lumbar spine was performed. No intravenous contrast was administered.   COMPARISON:  04/08/2016   FINDINGS: Segmentation: There are five lumbar type vertebral bodies. The last full intervertebral disc space is labeled L5-S1. This correlates with the prior MR examination.   Alignment:  Normal   Vertebrae:  Normal marrow signal.  No bone lesions or fractures   Conus medullaris and cauda equina: Conus extends to the T12-L1 level. Conus and cauda equina appear normal.   Paraspinal and other soft tissues: No significant paraspinal or retroperitoneal findings.   Disc levels:   T12-L1: No significant findings.   L1-2: Mild disc desiccation and slight bulging annulus with mild lateral recess encroachment but no spinal or foraminal stenosis.   L2-3: Slight bulging annulus and mild osteophytic ridging with mild lateral recess encroachment but no spinal or foraminal stenosis.   L3-4: Bulging degenerated annulus and osteophytic ridging with flattening of the ventral thecal sac and mild bilateral lateral recess stenosis. There is also mild bilateral extraforaminal encroachment on the L3 nerve roots. This appears stable.   L4-5: Bulging annulus and facet disease with mild to moderate bilateral lateral recess stenosis. There is also a right foraminal and extraforaminal disc protrusion slightly displacing the right L4 nerve root. This appears stable. No significant spinal stenosis.   L5-S1: Bulging degenerated annulus and moderate facet disease but the spinal canal is quite generous and there is no significant spinal or lateral recess stenosis. Mild left foraminal and extraforaminal encroachment on the  left L5 nerve root is stable.   IMPRESSION: 1. Mild-to-moderate multilevel disc disease and facet disease, unchanged. 2. Mild multilevel lateral recess stenosis without significant spinal stenosis. 3. Stable right foraminal and extraforaminal disc protrusion at L4-5 with mild displacement of the right L4 nerve root. 4. Stable mild foraminal and extraforaminal encroachment on the left L5 nerve root at L5-S1.     Electronically Signed   By: Marijo Sanes M.D.   On: 11/25/2018 12:29   He reports that he has never smoked. He has never used smokeless tobacco. No results for input(s): "HGBA1C", "LABURIC" in the last 8760 hours.  Objective:  VS:  HT:    WT:   BMI:     BP:(!) 106/46  HR:74bpm  TEMP: ( )  RESP:  Physical Exam Vitals and nursing note reviewed.  HENT:     Head: Normocephalic and atraumatic.     Right Ear: External ear normal.     Left Ear: External ear normal.     Nose: Nose normal.     Mouth/Throat:     Mouth: Mucous membranes are moist.  Eyes:     Extraocular Movements: Extraocular movements intact.  Cardiovascular:     Rate and Rhythm: Normal rate.     Pulses: Normal pulses.  Pulmonary:     Effort: Pulmonary effort is normal.  Abdominal:     General: Abdomen is flat. There is no distension.  Musculoskeletal:  General: Tenderness present.     Cervical back: Normal range of motion.     Comments: Pt is slow to rise from seated position to standing. Concordant low back pain with facet loading, lumbar spine extension and rotation. Strong distal strength without clonus, no pain upon palpation of greater trochanters. Sensation intact bilaterally. Walks independently, gait steady.   Skin:    General: Skin is warm and dry.     Capillary Refill: Capillary refill takes less than 2 seconds.  Neurological:     General: No focal deficit present.     Mental Status: He is alert and oriented to person, place, and time.  Psychiatric:        Mood and Affect: Mood  normal.        Behavior: Behavior normal.     Ortho Exam  Imaging: No results found.  Past Medical/Family/Surgical/Social History: Medications & Allergies reviewed per EMR, new medications updated. 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   Past Medical History:  Diagnosis Date   Arthritis    herniated disc lower back took injection 2017, occ lower back spams had epidural 2020   GERD (gastroesophageal reflux disease)    Hypertension    Prostate cancer ([place]) 2021   Family History  Problem Relation Age of Onset   COPD Mother    Diabetes Father    Heart attack Father    Prostate cancer Neg Hx    Colon cancer Neg Hx    Pancreatic cancer Neg Hx    Breast cancer Neg Hx    Past Surgical History:  Procedure Laterality Date   ACHILLES TENDON REPAIR Left yrs ago   ARTHROSCOPY KNEE W/ DRILLING Right yrs ago   CYSTOSCOPY N/A 12/20/2019   Procedure: CYSTOSCOPY FLEXIBLE;  Surgeon: Joie Bimler, MD;  Location: [facility];  Service: Urology;  Laterality: N/A;   EYE SURGERY Bilateral 2019   cataracts both eyes   HERNIA REPAIR Bilateral yrs ago   inguinal hernia    NASAL TURBINATE REDUCTION  05/2019   PROSTATE BIOPSY  2021   RADIOACTIVE SEED IMPLANT N/A 12/20/2019   Procedure: RADIOACTIVE SEED IMPLANT/BRACHYTHERAPY IMPLANT;  Surgeon: Joie Bimler, MD;  Location: [facility];  Service: Urology;  Laterality: N/A;   skin biopsies, nose  08/2019   SPACE OAR INSTILLATION N/A 12/20/2019   Procedure: SPACE OAR INSTILLATION;  Surgeon: Joie Bimler, MD;   Location: [facility];  Service: Urology;  Laterality: N/A;   Social History   Occupational History   Not on file  Tobacco Use   Smoking status: Never   Smokeless tobacco: Never  Vaping Use   Vaping Use: Never used  Substance and Sexual Activity   Alcohol use: Yes    Alcohol/week: 1.0 standard drink of alcohol    Types: 1 Cans of beer per week    Comment: 1 beerr month   Drug use: No   Sexual activity: Not Currently

## 2021-10-09 ENCOUNTER — Telehealth (HOSPITAL_COMMUNITY): Payer: Self-pay | Admitting: *Deleted

## 2021-10-09 ENCOUNTER — Telehealth: Payer: Self-pay | Admitting: Cardiology

## 2021-10-09 MED ORDER — METOPROLOL TARTRATE 100 MG PO TABS: ORAL_TABLET | ORAL | 0 refills | Status: DC

## 2021-10-09 NOTE — Telephone Encounter (Signed)
Rx sent as last office note instructions. Patient notified.

## 2021-10-09 NOTE — Telephone Encounter (Signed)
Reaching out to patient to offer assistance regarding upcoming cardiac imaging study; pt verbalizes understanding of appt date/time, parking situation and where to check in, pre-test NPO status and medications ordered, and verified current allergies; name and call back number provided for further questions should they arise  Gordy Clement RN Navigator Card[facility] [phone] office [phone] cell  Patient states he has medications for the test he has to pick up and is unsure what it is.  Informed him that if is metoprolol, he will need to take the medication TWO hours prior to his cardiac CT. He is aware to arrive at 2:30pm.

## 2021-10-09 NOTE — Telephone Encounter (Signed)
*  STAT* If patient is at the pharmacy, call can be transferred to refill team.   1. Which medications need to be refilled? (please list name of each medication and dose if known) Metoprolol  2. Which pharmacy/location (including street and city if local pharmacy) is medication to be sent to? [place], [place]  3. Do they need a 30 day or 90 day supply?    Patient needs 1 pill prior to the test

## 2021-10-12 ENCOUNTER — Ambulatory Visit (HOSPITAL_COMMUNITY)
Admission: RE | Admit: 2021-10-12 | Discharge: 2021-10-12 | Disposition: A | Discharge status: Home / Self Care | Payer: PPO | Source: Ambulatory Visit | Attending: Cardiology | Admitting: Cardiology

## 2021-10-12 DIAGNOSIS — R931 Abnormal findings on diagnostic imaging of heart and coronary circulation: Secondary | ICD-10-CM | POA: Insufficient documentation

## 2021-10-12 DIAGNOSIS — I2510 Atherosclerotic heart disease of native coronary artery without angina pectoris: Secondary | ICD-10-CM

## 2021-10-12 DIAGNOSIS — R072 Precordial pain: Secondary | ICD-10-CM | POA: Diagnosis not present

## 2021-10-12 MED ORDER — IOHEXOL 350 MG/ML SOLN
100.0000 mL | Freq: Once | INTRAVENOUS | Status: AC | PRN
  Administered 2021-10-12: 100 mL via INTRAVENOUS

## 2021-10-12 MED ORDER — NITROGLYCERIN 0.4 MG SL SUBL
SUBLINGUAL_TABLET | SUBLINGUAL | Status: AC
  Filled 2021-10-12: qty 2

## 2021-10-12 MED ORDER — NITROGLYCERIN 0.4 MG SL SUBL
0.8000 mg | SUBLINGUAL_TABLET | Freq: Once | SUBLINGUAL | Status: AC
  Administered 2021-10-12: 0.8 mg via SUBLINGUAL

## 2021-10-13 ENCOUNTER — Other Ambulatory Visit: Payer: Self-pay | Admitting: Cardiology

## 2021-10-13 DIAGNOSIS — R931 Abnormal findings on diagnostic imaging of heart and coronary circulation: Secondary | ICD-10-CM

## 2021-10-13 DIAGNOSIS — I2510 Atherosclerotic heart disease of native coronary artery without angina pectoris: Secondary | ICD-10-CM | POA: Diagnosis not present

## 2021-10-13 NOTE — Progress Notes (Signed)
.  hh

## 2021-10-14 ENCOUNTER — Ambulatory Visit (HOSPITAL_BASED_OUTPATIENT_CLINIC_OR_DEPARTMENT_OTHER)
Admission: RE | Admit: 2021-10-14 | Discharge: 2021-10-14 | Disposition: A | Discharge status: Home / Self Care | Payer: PPO | Source: Ambulatory Visit | Attending: Cardiology | Admitting: Cardiology

## 2021-10-14 ENCOUNTER — Ambulatory Visit (HOSPITAL_COMMUNITY)
Admission: RE | Admit: 2021-10-14 | Discharge: 2021-10-14 | Disposition: A | Discharge status: Home / Self Care | Payer: PPO | Source: Ambulatory Visit | Attending: Cardiology | Admitting: Cardiology

## 2021-10-14 DIAGNOSIS — R931 Abnormal findings on diagnostic imaging of heart and coronary circulation: Secondary | ICD-10-CM

## 2021-10-16 ENCOUNTER — Other Ambulatory Visit: Payer: Self-pay | Admitting: Cardiology

## 2021-10-16 ENCOUNTER — Telehealth: Payer: Self-pay

## 2021-10-21 ENCOUNTER — Telehealth: Payer: Self-pay | Admitting: Cardiology

## 2021-10-21 NOTE — Telephone Encounter (Signed)
Received a stat call from the [facility] regarding a patient that was short of breath. The call was transferred to me and the patient reported that he was short of breath and his pulse was low. He stated that his pulse ranged from 52-69. At that time Dr. Agustin Cree was coming out of a patient room and I informed him of the patients condition and his symptoms. Dr. Agustin Cree reviewed the patients cardiac testing and per Dr. Agustin Cree, it didn't look like his symptoms are cardiac related and he recommended that the patient follow up with his PCP for the symptoms he was experiencing. The patient stated that he would do that and had no further questions.

## 2021-10-21 NOTE — Telephone Encounter (Signed)
STAT if HR is under 50 or over 120 (normal HR is 60-100 beats per minute)  What is your heart rate? 53-59  Do you have a log of your heart rate readings (document readings)?  52 53 80 63 59  Do you have any other symptoms? Fatigue and SOB if he walks outside.   Pt c/o Shortness Of Breath: STAT if SOB developed within the last 24 hours or pt is noticeably SOB on the phone  1. Are you currently SOB (can you hear that pt is SOB on the phone)? yes  2. How long have you been experiencing SOB? N/a  3. Are you SOB when sitting or when up moving around? Moving aroud  4. Are you currently experiencing any other symptoms? Pt states that he is fatigue and SOB when he is walking around. He states that he is SOB right now. He also provided a  list of HR reading. Transferred call to triage.

## 2021-10-22 ENCOUNTER — Other Ambulatory Visit: Payer: Self-pay | Admitting: Cardiology

## 2021-10-30 ENCOUNTER — Telehealth: Payer: Self-pay | Admitting: Physical Medicine and Rehabilitation

## 2021-10-30 DIAGNOSIS — S81809A Unspecified open wound, unspecified lower leg, initial encounter: Secondary | ICD-10-CM

## 2021-10-30 DIAGNOSIS — L0390 Cellulitis, unspecified: Secondary | ICD-10-CM

## 2021-10-30 DIAGNOSIS — L03115 Cellulitis of right lower limb: Secondary | ICD-10-CM | POA: Diagnosis not present

## 2021-10-30 NOTE — Telephone Encounter (Signed)
Patient's wife [name] called needing to Cx appointment for the patient. She advised will call back to reschedule.

## 2021-11-02 ENCOUNTER — Encounter: Payer: PPO | Admitting: Physical Medicine and Rehabilitation

## 2021-11-03 DIAGNOSIS — I499 Cardiac arrhythmia, unspecified: Secondary | ICD-10-CM | POA: Diagnosis not present

## 2021-11-03 DIAGNOSIS — D510 Vitamin B12 deficiency anemia due to intrinsic factor deficiency: Secondary | ICD-10-CM | POA: Diagnosis not present

## 2021-11-03 DIAGNOSIS — Z6827 Body mass index (BMI) 27.0-27.9, adult: Secondary | ICD-10-CM | POA: Diagnosis not present

## 2021-11-03 DIAGNOSIS — I10 Essential (primary) hypertension: Secondary | ICD-10-CM | POA: Diagnosis not present

## 2021-11-10 ENCOUNTER — Other Ambulatory Visit: Payer: Self-pay | Admitting: Physical Medicine and Rehabilitation

## 2021-11-10 DIAGNOSIS — M545 Low back pain, unspecified: Secondary | ICD-10-CM

## 2021-11-10 DIAGNOSIS — G8929 Other chronic pain: Secondary | ICD-10-CM

## 2021-11-10 DIAGNOSIS — M47816 Spondylosis without myelopathy or radiculopathy, lumbar region: Secondary | ICD-10-CM

## 2021-11-10 NOTE — Progress Notes (Signed)
Patients wife called, patient is requesting lumbar MRI imaging prior to facet joint injections. I did place order for lumbar MRI today.

## 2021-11-15 ENCOUNTER — Ambulatory Visit
Admission: RE | Admit: 2021-11-15 | Discharge: 2021-11-15 | Disposition: A | Discharge status: Home / Self Care | Payer: PPO | Source: Ambulatory Visit | Attending: Physical Medicine and Rehabilitation | Admitting: Physical Medicine and Rehabilitation

## 2021-11-15 DIAGNOSIS — M47816 Spondylosis without myelopathy or radiculopathy, lumbar region: Secondary | ICD-10-CM

## 2021-11-15 DIAGNOSIS — M545 Low back pain, unspecified: Secondary | ICD-10-CM | POA: Diagnosis not present

## 2021-11-20 DIAGNOSIS — I10 Essential (primary) hypertension: Secondary | ICD-10-CM | POA: Insufficient documentation

## 2021-11-20 DIAGNOSIS — H579 Unspecified disorder of eye and adnexa: Secondary | ICD-10-CM | POA: Insufficient documentation

## 2021-11-23 ENCOUNTER — Encounter: Payer: Self-pay | Admitting: Physical Medicine and Rehabilitation

## 2021-11-23 ENCOUNTER — Ambulatory Visit (INDEPENDENT_AMBULATORY_CARE_PROVIDER_SITE_OTHER): Payer: PPO | Admitting: Physical Medicine and Rehabilitation

## 2021-11-23 DIAGNOSIS — M5416 Radiculopathy, lumbar region: Secondary | ICD-10-CM | POA: Diagnosis not present

## 2021-11-23 DIAGNOSIS — M4726 Other spondylosis with radiculopathy, lumbar region: Secondary | ICD-10-CM

## 2021-11-23 DIAGNOSIS — M47816 Spondylosis without myelopathy or radiculopathy, lumbar region: Secondary | ICD-10-CM

## 2021-11-23 DIAGNOSIS — M5116 Intervertebral disc disorders with radiculopathy, lumbar region: Secondary | ICD-10-CM | POA: Diagnosis not present

## 2021-11-23 NOTE — Progress Notes (Unsigned)
Pt here today for review of his MRI lumbar.

## 2021-11-23 NOTE — Progress Notes (Unsigned)
Wesley Jackson - 81 y.o. male MRN [id number]  Date of birth: 22-Apr-1941  Office Visit Note: Visit Date: 11/23/2021 PCP: Angelina Sheriff, MD Referred by: Angelina Sheriff, MD  Subjective: Chief Complaint  Patient presents with   Lower Back - Pain   HPI: FORTINO HAAG is a 81 y.o. male who comes in today for evaluation of chronic, worsening and severe bilateral lower back pain radiating to buttocks, left greater than right. Pain ongoing for several years, worsened over the last month. States his pain is exacerbated by moving from sitting to standing position, twisting and bending, currently rates as 7 out of 10. Patient reports some relief of pain with home exercise regimen, use of heating pad/ice pack, rest and Aleve as needed. Patient has attended formal physical therapy in the past, feels these treatments were not beneficial in alleviating pain. Patients recent lumbar MRI imaging exhibits new disc protrusion at L3-L4  effacing the right lateral recess and contacting the descending right L4 nerve roots. Right lateral disc protrusion remains at L4-L5 and is slightly progressed from prior exam. No high grade spinal canal stenosis noted. Patient has a history of multiple transforaminal epidural steroid injections at the level of L3 and L4 performed in our office over the years with good relief of pain. During our last office visit in June patient had not been able to play golf due to severe pain, states he played 3-4 times last week and feels some better. Patient and wife are planning beach trip and requesting to have injection after they return from vacation. Patient denies focal weakness, numbness and tingling. Patient denies recent trauma or falls.      Review of Systems  Musculoskeletal:  Positive for back pain.  Neurological:  Negative for tingling, sensory change, focal weakness and weakness.  All other systems reviewed and are negative.  Otherwise per HPI.  Assessment &  Plan: Visit Diagnoses:    ICD-10-CM   1. Lumbar radiculopathy  M54.16 Ambulatory referral to Physical Medicine Rehab    2. Intervertebral disc disorders with radiculopathy, lumbar region  M51.16 Ambulatory referral to Physical Medicine Rehab    3. Other spondylosis with radiculopathy, lumbar region  M47.26 Ambulatory referral to Physical Medicine Rehab    4. Facet hypertrophy of lumbar region  M47.816 Ambulatory referral to Physical Medicine Rehab       Plan: Findings:  Chronic, worsening and severe bilateral lower back pain radiating to buttock, left greater than right. Patient continues to have severe pain despite good conservative therapies such as formal physical therapy, home exercise regimen, rest and use of medications. Patients clinical presentation and exam are consistent with L4 nerve pattern. Next step is to perform a diagnostic and hopefully therapeutic left L4-L5 interlaminar epidural steroid injection under fluoroscopic guidance. Patient is not currently taking anticoagulant medications. If patients lower back pain persists we would consider performing facet joint injections. Patient encouraged to remain active as tolerated. We will work on scheduling procedure after he returns from vacation. No red flag symptoms noted upon exam today.     Meds & Orders: No orders of the defined types were placed in this encounter.   Orders Placed This Encounter  Procedures   Ambulatory referral to Physical Medicine Rehab    Follow-up: Return for Left L4-L5 interlaminar epidural steroid injection.   Procedures: No procedures performed      Clinical History: EXAM: MRI LUMBAR SPINE WITHOUT CONTRAST   TECHNIQUE: Multiplanar, multisequence MR imaging of  the lumbar spine was performed. No intravenous contrast was administered.   COMPARISON:  11/25/2018   FINDINGS: Segmentation:  Standard.   Alignment:  Trace retrolisthesis L2 on L3.   Vertebrae:  No fracture, evidence of discitis,  or bone lesion.   Conus medullaris and cauda equina: Conus extends to the L1 level. Conus and cauda equina appear normal.   Paraspinal and other soft tissues: Negative.   Disc levels:   T12-L1: No significant disc bulge. No spinal canal stenosis or neural foraminal narrowing.   L1-L2: Looks field minimal disc bulge. Mild facet arthropathy. No spinal canal stenosis or neural foraminal narrowing.   L2-L3: Trace retrolisthesis. Mild disc bulge. Mild facet arthropathy. No spinal canal stenosis or neural foraminal narrowing.   L3-L4: Mild disc bulge with right subarticular and foraminal disc protrusion, new from the prior exam, which effaces the right lateral recess and contacts the descending right L4 nerve roots. No spinal canal stenosis. Mild left and moderate right neural foraminal narrowing, which have progressed slightly from the prior exam.   L4-L5: Mild disc bulge with slightly increased right foraminal and extreme lateral disc protrusion, which displaces the exiting right L4 nerve roots, slightly progressed from the prior exam. Narrowing of the lateral recesses. Mild facet arthropathy. No spinal canal stenosis. Moderate right neural foraminal narrowing.   L5-S1: Mild disc bulge. Mild facet arthropathy. No spinal canal stenosis. Mild left neural foraminal narrowing, unchanged.   IMPRESSION: 1. L3-L4 disc protrusion effaces the right lateral recess and contacts the descending right L4 nerve roots, new from the prior exam. Moderate right and mild left neural foraminal narrowing at this level have progressed slightly from the prior exam. 2. L4-L5 right extreme lateral disc protrusion displaces the exiting right L4 nerve roots slightly progressed from the prior exam. Unchanged moderate right neural foraminal narrowing. Narrowing of the lateral recesses at this level could affect the descending L5 nerve roots. 3. L5-S1 mild left neural foraminal narrowing, unchanged.      Electronically Signed   By: Merilyn Baba M.D.   On: 11/16/2021 11:31   He reports that he has never smoked. He has never used smokeless tobacco. No results for input(s): "HGBA1C", "LABURIC" in the last 8760 hours.  Objective:  VS:  HT:    WT:   BMI:     BP:   HR: bpm  TEMP: ( )  RESP:  Physical Exam Vitals and nursing note reviewed.  HENT:     Head: Normocephalic and atraumatic.     Right Ear: External ear normal.     Left Ear: External ear normal.     Nose: Nose normal.     Mouth/Throat:     Mouth: Mucous membranes are moist.  Eyes:     Extraocular Movements: Extraocular movements intact.  Cardiovascular:     Rate and Rhythm: Normal rate.     Pulses: Normal pulses.  Pulmonary:     Effort: Pulmonary effort is normal.  Abdominal:     General: Abdomen is flat. There is no distension.  Musculoskeletal:        General: Tenderness present.     Cervical back: Normal range of motion.     Comments: Pt rises from seated position to standing without difficulty. Good lumbar range of motion. Strong distal strength without clonus, no pain upon palpation of greater trochanters. Dysesthesias noted to L4 dermatome. Sensation intact bilaterally. Walks independently, gait steady.   Skin:    General: Skin is warm and dry.  Capillary Refill: Capillary refill takes less than 2 seconds.  Neurological:     General: No focal deficit present.     Mental Status: He is alert and oriented to person, place, and time.  Psychiatric:        Mood and Affect: Mood normal.        Behavior: Behavior normal.     Ortho Exam  Imaging: No results found.  Past Medical/Family/Surgical/Social History: Medications & Allergies reviewed per EMR, new medications updated. 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   Past Medical History:  Diagnosis Date   Arthritis    herniated disc lower back took injection 2017, occ lower back spams had epidural 2020   Atypical chest pain 09/22/2021   Cellulitis 10/30/2021   Dyslipidemia 09/22/2021   Dyspnea on exertion 09/22/2021   Eye problem 11/20/2021   GERD (gastroesophageal reflux disease)    Hypertension    Nasal congestion with rhinorrhea 01/21/2015   Open wound of lower extremity 10/30/2021   Prostate cancer ([place]) 2021   Family History  Problem Relation Age of Onset   COPD Mother    Diabetes Father    Heart attack Father    Prostate cancer Neg Hx    Colon cancer Neg Hx    Pancreatic cancer Neg Hx    Breast cancer Neg Hx    Past Surgical History:  Procedure Laterality Date   ACHILLES TENDON REPAIR Left yrs ago   ARTHROSCOPY KNEE W/ DRILLING Right yrs ago   CYSTOSCOPY N/A 12/20/2019   Procedure: CYSTOSCOPY FLEXIBLE;  Surgeon: Joie Bimler, MD;  Location: [facility];  Service: Urology;  Laterality: N/A;   EYE SURGERY Bilateral 2019   cataracts both eyes   HERNIA REPAIR Bilateral yrs ago   inguinal hernia    NASAL TURBINATE REDUCTION  05/2019   PROSTATE BIOPSY  2021   RADIOACTIVE SEED IMPLANT N/A 12/20/2019   Procedure: RADIOACTIVE SEED IMPLANT/BRACHYTHERAPY IMPLANT;  Surgeon: Joie Bimler, MD;  Location: [facility];  Service: Urology;  Laterality: N/A;   skin biopsies, nose  08/2019   SPACE OAR INSTILLATION N/A 12/20/2019   Procedure: SPACE OAR INSTILLATION;  Surgeon: Joie Bimler, MD;  Location: [facility];  Service: Urology;  Laterality: N/A;   Social History   Occupational History   Not on file   Tobacco Use   Smoking status: Never   Smokeless tobacco: Never  Vaping Use   Vaping Use: Never used  Substance and Sexual Activity   Alcohol use: Yes    Alcohol/week: 1.0 standard drink of alcohol    Types: 1 Cans of beer per week    Comment: 1 beerr month   Drug use: No   Sexual activity: Not Currently

## 2021-11-24 ENCOUNTER — Ambulatory Visit (INDEPENDENT_AMBULATORY_CARE_PROVIDER_SITE_OTHER): Payer: PPO | Admitting: Cardiology

## 2021-11-24 ENCOUNTER — Encounter: Payer: Self-pay | Admitting: Cardiology

## 2021-11-24 VITALS — BP 126/60 | HR 38 | Ht 68.0 in | Wt 192.2 lb

## 2021-11-24 DIAGNOSIS — I493 Ventricular premature depolarization: Secondary | ICD-10-CM | POA: Diagnosis not present

## 2021-11-24 DIAGNOSIS — I2510 Atherosclerotic heart disease of native coronary artery without angina pectoris: Secondary | ICD-10-CM | POA: Insufficient documentation

## 2021-11-24 DIAGNOSIS — E785 Hyperlipidemia, unspecified: Secondary | ICD-10-CM

## 2021-11-24 DIAGNOSIS — I10 Essential (primary) hypertension: Secondary | ICD-10-CM

## 2021-11-24 MED ORDER — ROSUVASTATIN CALCIUM 10 MG PO TABS: 10.0000 mg | ORAL_TABLET | Freq: Every day | ORAL | 3 refills | Status: DC

## 2021-11-24 NOTE — Patient Instructions (Signed)
Medication Instructions:  Your physician has recommended you make the following change in your medication:  Start Crestor 10 mg once daily in the evening  *If you need a refill on your cardiac medications before your next appointment, please call your pharmacy*   Lab Work: Your physician recommends that you return for lab work in: Today for a Direct LDL In 6 weeks come back for a fasting Lipid Profile, AST & ALT (Sept. 12th) Lab opens at [place] an appointment. Best time to come is between 8am and 12noon and between 1:30 and 4:30. If you have been asked to fast for your blood work please have nothing to eat or drink after midnight. You may have water.   If you have labs (blood work) drawn today and your tests are completely normal, you will receive your results only by: [place] (if you have MyChart) OR A paper copy in the mail If you have any lab test that is abnormal or we need to change your treatment, we will call you to review the results.   Testing/Procedures: NONE   Follow-Up: At [facility], you and your health needs are our priority.  As part of our continuing mission to provide you with exceptional heart care, we have created designated Provider Care Teams.  These Care Teams include your primary Cardiologist (physician) and Advanced Practice Providers (APPs -  Physician Assistants and Nurse Practitioners) who all work together to provide you with the care you need, when you need it.  We recommend signing up for the patient portal called "MyChart".  Sign up information is provided on this After Visit Summary.  MyChart is used to connect with patients for Virtual Visits (Telemedicine).  Patients are able to view lab/test results, encounter notes, upcoming appointments, etc.  Non-urgent messages can be sent to your provider as well.   To learn more about what you can do with MyChart, go to [email or web address].    Your next appointment:   6  month(s)  The format for your next appointment:   In Person  Provider:   Jenne Campus, MD    Other Instructions   Important Information About Sugar

## 2021-11-24 NOTE — Progress Notes (Unsigned)
Cardiology Office Note:    Date:  11/24/2021   ID:  Wesley MEMBRENO, DOB January 24, 1941, MRN [id number]  PCP:  Wesley Sheriff, MD  Cardiologist:  Wesley Campus, MD    Referring MD: Wesley Sheriff, MD   Chief Complaint  Patient presents with   Follow-up    HR improved some    History of Present Illness:    Wesley Jackson is a 81 y.o. male who was referred to [place] because of weakness fatigue and tiredness that happened after COVID-19 infection.  Evaluation included echocardiogram which showed preserved left ventricle ejection fraction.  He also had coronary CT angio which showed moderate disease of the mid LAD however fractional flow reserve was negative.  I brought him back to the office today to talk about it.  Overall he is feeling better he have more energy he can do more.  He is trying to walk on the regular basis keep about 10,000 steps.  Denies have any chest pain tightness squeezing pressure burning chest.  Past Medical History:  Diagnosis Date   Arthritis    herniated disc lower back took injection 2017, occ lower back spams had epidural 2020   Atypical chest pain 09/22/2021   Cellulitis 10/30/2021   Dyslipidemia 09/22/2021   Dyspnea on exertion 09/22/2021   Eye problem 11/20/2021   GERD (gastroesophageal reflux disease)    Hypertension    Nasal congestion with rhinorrhea 01/21/2015   Open wound of lower extremity 10/30/2021   Prostate cancer ([place]) 2021    Past Surgical History:  Procedure Laterality Date   ACHILLES TENDON REPAIR Left yrs ago   ARTHROSCOPY KNEE W/ DRILLING Right yrs ago   CYSTOSCOPY N/A 12/20/2019   Procedure: CYSTOSCOPY FLEXIBLE;  Surgeon: Joie Bimler, MD;  Location[facility];  Service: Urology;  Laterality: N/A;   EYE SURGERY Bilateral 2019   cataracts both eyes   HERNIA REPAIR Bilateral yrs ago   inguinal hernia    NASAL TURBINATE REDUCTION  05/2019   PROSTATE BIOPSY  2021   RADIOACTIVE SEED IMPLANT N/A 12/20/2019   Procedure:  RADIOACTIVE SEED IMPLANT/BRACHYTHERAPY IMPLANT;  Surgeon: Joie Bimler, MD;  [facility];  Service: Urology;  Laterality: N/A;   skin biopsies, nose  08/2019   SPACE OAR INSTILLATION N/A 12/20/2019   Procedure: SPACE OAR INSTILLATION;  Surgeon: Joie Bimler, MD;  Location: [facility];  Service: Urology;  Laterality: N/A;    Current Medications: Current Meds  Medication Sig   amLODipine (NORVASC) 5 MG tablet Take 5 mg by mouth daily.   aspirin EC 81 MG tablet Take 1 tablet (81 mg total) by mouth daily. Swallow whole.   finasteride (PROSCAR) 5 MG tablet Take 5 mg by mouth daily.   Multiple Vitamins-Minerals (CENTRUM SILVER ADULT 50+ PO) Take 1 tablet by mouth every other day.   olmesartan (BENICAR) 20 MG tablet Take 20 mg by mouth daily.   omeprazole (PRILOSEC) 20 MG capsule Take 20 mg by mouth every morning.   Probiotic Product (PROBIOTIC GUMMIES PO) Take 1 capsule by mouth every other day.   tadalafil (CIALIS) 5 MG tablet Take 5 mg by mouth daily as needed for erectile dysfunction. ED   tamsulosin (FLOMAX) 0.4 MG CAPS capsule Take 0.4 mg by mouth daily after supper.   [DISCONTINUED] cloNIDine (CATAPRES) 0.1 MG tablet Take 0.1 mg by mouth 2 (two) times daily.   [DISCONTINUED] fexofenadine-pseudoephedrine (ALLEGRA-D 24) 180-240 MG 24 hr tablet Take 1  tablet by mouth as needed (Allergies).   [DISCONTINUED] metoprolol tartrate (LOPRESSOR) 100 MG tablet Take 1 tablet 2 hours prior to CT scan appointment date.     Allergies:   Patient has no known allergies.   Social History   Socioeconomic History   Marital status: Married    Spouse name: Not on file   Number of children: Not on file   Years of education: Not on file   Highest education level: Not on file  Occupational History   Not on file  Tobacco Use   Smoking status: Never   Smokeless tobacco: Never  Vaping Use   Vaping Use: Never used  Substance and Sexual Activity   Alcohol use: Yes     Alcohol/week: 1.0 standard drink of alcohol    Types: 1 Cans of beer per week    Comment: 1 beerr month   Drug use: No   Sexual activity: Not Currently  Other Topics Concern   Not on file  Social History Narrative   Not on file   Social Determinants of Health   Financial Resource Strain: Not on file  Food Insecurity: Not on file  Transportation Needs: Not on file  Physical Activity: Not on file  Stress: Not on file  Social Connections: Not on file     Family History: The patient's family history includes COPD in his mother; Diabetes in his father; Heart attack in his father. There is no history of Prostate cancer, Colon cancer, Pancreatic cancer, or Breast cancer. ROS:   Please see the history of present illness.    All 14 point review of systems negative except as described per history of present illness  EKGs/Labs/Other Studies Reviewed:      Recent Labs: 09/29/2021: BUN 8; Creatinine, Ser 0.83; Potassium 4.2; Sodium 142  Recent Lipid Panel No results found for: "CHOL", "TRIG", "HDL", "CHOLHDL", "VLDL", "LDLCALC", "LDLDIRECT"  Physical Exam:    VS:  BP 126/60 (BP Location: Left Arm, Patient Position: Sitting)   Pulse (!) 38   Ht '5\' 8"'$  (1.727 m)   Wt 192 lb 3.2 oz (87.2 kg)   SpO2 94%   BMI 29.22 kg/m     Wt Readings from Last 3 Encounters:  11/24/21 192 lb 3.2 oz (87.2 kg)  09/22/21 182 lb 3.2 oz (82.6 kg)  12/02/20 186 lb 9.6 oz (84.6 kg)     GEN:  Well nourished, well developed in no acute distress HEENT: Normal NECK: No JVD; No carotid bruits LYMPHATICS: No lymphadenopathy CARDIAC: RRR, no murmurs, no rubs, no gallops RESPIRATORY:  Clear to auscultation without rales, wheezing or rhonchi  ABDOMEN: Soft, non-tender, non-distended MUSCULOSKELETAL:  No edema; No deformity  SKIN: Warm and dry LOWER EXTREMITIES: no swelling NEUROLOGIC:  Alert and oriented x 3 PSYCHIATRIC:  Normal affect   ASSESSMENT:    1. Primary hypertension   2. Coronary artery  disease involving native coronary artery of native heart without angina pectoris   3. Dyslipidemia   4. PVC's (premature ventricular contractions)    PLAN:    In order of problems listed above:  Coronary artery disease coronary CT angio showed moderate disease involving mid LAD.  The key is risk factors modifications, he is completely asymptomatic, he is on antiplatelet therapy which I will continue.  He does not need an antianginal therapy because he does not have any symptoms Dyslipidemia I do have fasting lipid profile but from 2020 with total cholesterol 163 HDL 40.  Because of known coronary artery disease  he need to be treated aggressively.  I will put him on Crestor 10 I will check direct LDL today as a baseline and then 6-week later we will check his fasting lipid profile Essential hypertension blood pressure well controlled. PVCs denies have any dizziness or passing out.  We will continue monitoring. We had a great deal of time talking about risk factors modifications.  I discussed basic of Mediterranean diet I discussed basic of need to exercise 5 times a week for 30 minutes.   Medication Adjustments/Labs and Tests Ordered: Current medicines are reviewed at length with the patient today.  Concerns regarding medicines are outlined above.  No orders of the defined types were placed in this encounter.  Medication changes: No orders of the defined types were placed in this encounter.   Signed, Park Liter, MD, [facility] 11/24/2021 2:36 PM    [place]

## 2021-11-25 DIAGNOSIS — D510 Vitamin B12 deficiency anemia due to intrinsic factor deficiency: Secondary | ICD-10-CM | POA: Diagnosis not present

## 2021-11-25 LAB — LDL CHOLESTEROL, DIRECT: LDL Direct: 100 mg/dL — ABNORMAL HIGH (ref 0–99)

## 2021-12-01 ENCOUNTER — Telehealth: Payer: Self-pay

## 2021-12-01 DIAGNOSIS — E785 Hyperlipidemia, unspecified: Secondary | ICD-10-CM

## 2021-12-01 MED ORDER — ROSUVASTATIN CALCIUM 20 MG PO TABS
20.0000 mg | ORAL_TABLET | Freq: Every day | ORAL | 2 refills | Status: DC
Start: 2021-12-01 — End: 2022-01-15

## 2021-12-01 NOTE — Telephone Encounter (Signed)
Patient informed of results and recommendation and agreed with plan. Rx sent, patient aware to be fasting for blood work. Lab order on file

## 2021-12-01 NOTE — Telephone Encounter (Signed)
-----   Message from Park Liter, MD sent at 11/27/2021  9:57 AM EDT ----- Cholesterol much better.  I would recommend doubling the dose of Crestor to 20 mg daily and fasting lipid profile is TLT in 6 weeks

## 2021-12-04 DIAGNOSIS — C61 Malignant neoplasm of prostate: Secondary | ICD-10-CM | POA: Diagnosis not present

## 2021-12-04 DIAGNOSIS — R351 Nocturia: Secondary | ICD-10-CM | POA: Diagnosis not present

## 2021-12-10 ENCOUNTER — Encounter: Payer: Self-pay | Admitting: Physical Medicine and Rehabilitation

## 2021-12-10 ENCOUNTER — Ambulatory Visit (INDEPENDENT_AMBULATORY_CARE_PROVIDER_SITE_OTHER): Payer: PPO | Admitting: Physical Medicine and Rehabilitation

## 2021-12-10 ENCOUNTER — Ambulatory Visit: Payer: Self-pay

## 2021-12-10 VITALS — BP 136/57 | HR 88

## 2021-12-10 DIAGNOSIS — L570 Actinic keratosis: Secondary | ICD-10-CM | POA: Diagnosis not present

## 2021-12-10 DIAGNOSIS — M5416 Radiculopathy, lumbar region: Secondary | ICD-10-CM | POA: Diagnosis not present

## 2021-12-10 MED ORDER — METHYLPREDNISOLONE ACETATE 80 MG/ML IJ SUSP
40.0000 mg | Freq: Once | INTRAMUSCULAR | Status: AC
  Administered 2021-12-10: 40 mg

## 2021-12-10 NOTE — Progress Notes (Signed)
Pt state lower back pain. Pt state sitting and bending makes the pain worse. Pt state he uses ice, heat and takes over the counter pain meds to help ease his pain.  Numeric Pain Rating Scale and Functional Assessment Average Pain 7   In the last MONTH (on 0-10 scale) has pain interfered with the following?  1. General activity like being  able to carry out your everyday physical activities such as walking, climbing stairs, carrying groceries, or moving a chair?  Rating(7)   +Driver, -BT, -Dye Allergies.

## 2021-12-10 NOTE — Patient Instructions (Signed)

## 2021-12-23 NOTE — Progress Notes (Signed)
Wesley Jackson - 81 y.o. male MRN [id number]  Date of birth: [birth year]  Office Visit Note: Visit Date: 12/10/2021 PCP: Angelina Sheriff, MD Referred by: Angelina Sheriff, MD  Subjective: Chief Complaint  Patient presents with   Lower Back - Pain   HPI:  Wesley Jackson is a 81 y.o. male who comes in today at the request of Barnet Pall, FNP for planned Left L4-5 Lumbar Interlaminar epidural steroid injection with fluoroscopic guidance.  The patient has failed conservative care including home exercise, medications, time and activity modification.  This injection will be diagnostic and hopefully therapeutic.  Please see requesting physician notes for further details and justification.   ROS Otherwise per HPI.  Assessment & Plan: Visit Diagnoses:    ICD-10-CM   1. Lumbar radiculopathy  M54.16 XR C-ARM NO REPORT    Epidural Steroid injection    methylPREDNISolone acetate (DEPO-MEDROL) injection 40 mg      Plan: No additional findings.   Meds & Orders:  Meds ordered this encounter  Medications   methylPREDNISolone acetate (DEPO-MEDROL) injection 40 mg    Orders Placed This Encounter  Procedures   XR C-ARM NO REPORT   Epidural Steroid injection    Follow-up: Return for visit to requesting provider as needed.   Procedures: No procedures performed  Lumbar Epidural Steroid Injection - Interlaminar Approach with Fluoroscopic Guidance  Patient: Wesley Jackson      Date of Birth: 1940/06/03 MRN: [id number] PCP: Angelina Sheriff, MD      Visit Date: 12/10/2021   Universal Protocol:     Consent Given By: the patient  Position: PRONE  Additional Comments: Vital signs were monitored before and after the procedure. Patient was prepped and draped in the usual sterile fashion. The correct patient, procedure, and site was verified.   Injection Procedure Details:   Procedure diagnoses: Lumbar radiculopathy [M54.16]   Meds Administered:  Meds ordered this  encounter  Medications   methylPREDNISolone acetate (DEPO-MEDROL) injection 40 mg     Laterality: Left  Location/Site:  L4-5  Needle: 3.5 in., 20 ga. Tuohy  Needle Placement: Paramedian epidural  Findings:   -Comments: Excellent flow of contrast into the epidural space.  Procedure Details: Using a paramedian approach from the side mentioned above, the region overlying the inferior lamina was localized under fluoroscopic visualization and the soft tissues overlying this structure were infiltrated with 4 ml. of 1% Lidocaine without Epinephrine. The Tuohy needle was inserted into the epidural space using a paramedian approach.   The epidural space was localized using loss of resistance along with counter oblique bi-planar fluoroscopic views.  After negative aspirate for air, blood, and CSF, a 2 ml. volume of Isovue-250 was injected into the epidural space and the flow of contrast was observed. Radiographs were obtained for documentation purposes.    The injectate was administered into the level noted above.   Additional Comments:  The patient tolerated the procedure well Dressing: 2 x 2 sterile gauze and Band-Aid    Post-procedure details: Patient was observed during the procedure. Post-procedure instructions were reviewed.  Patient left the clinic in stable condition.   Clinical History: EXAM: MRI LUMBAR SPINE WITHOUT CONTRAST   TECHNIQUE: Multiplanar, multisequence MR imaging of the lumbar spine was performed. No intravenous contrast was administered.   COMPARISON:  11/25/2018   FINDINGS: Segmentation:  Standard.   Alignment:  Trace retrolisthesis L2 on L3.   Vertebrae:  No fracture, evidence of discitis, or bone  lesion.   Conus medullaris and cauda equina: Conus extends to the L1 level. Conus and cauda equina appear normal.   Paraspinal and other soft tissues: Negative.   Disc levels:   T12-L1: No significant disc bulge. No spinal canal stenosis or neural  foraminal narrowing.   L1-L2: Looks field minimal disc bulge. Mild facet arthropathy. No spinal canal stenosis or neural foraminal narrowing.   L2-L3: Trace retrolisthesis. Mild disc bulge. Mild facet arthropathy. No spinal canal stenosis or neural foraminal narrowing.   L3-L4: Mild disc bulge with right subarticular and foraminal disc protrusion, new from the prior exam, which effaces the right lateral recess and contacts the descending right L4 nerve roots. No spinal canal stenosis. Mild left and moderate right neural foraminal narrowing, which have progressed slightly from the prior exam.   L4-L5: Mild disc bulge with slightly increased right foraminal and extreme lateral disc protrusion, which displaces the exiting right L4 nerve roots, slightly progressed from the prior exam. Narrowing of the lateral recesses. Mild facet arthropathy. No spinal canal stenosis. Moderate right neural foraminal narrowing.   L5-S1: Mild disc bulge. Mild facet arthropathy. No spinal canal stenosis. Mild left neural foraminal narrowing, unchanged.   IMPRESSION: 1. L3-L4 disc protrusion effaces the right lateral recess and contacts the descending right L4 nerve roots, new from the prior exam. Moderate right and mild left neural foraminal narrowing at this level have progressed slightly from the prior exam. 2. L4-L5 right extreme lateral disc protrusion displaces the exiting right L4 nerve roots slightly progressed from the prior exam. Unchanged moderate right neural foraminal narrowing. Narrowing of the lateral recesses at this level could affect the descending L5 nerve roots. 3. L5-S1 mild left neural foraminal narrowing, unchanged.     Electronically Signed   By: Merilyn Baba M.D.   On: 11/16/2021 11:31     Objective:  VS:  HT:    WT:   BMI:     BP:(!) 136/57  HR:88bpm  TEMP: ( )  RESP:  Physical Exam Vitals and nursing note reviewed.  Constitutional:      General: He is not in  acute distress.    Appearance: Normal appearance. He is well-developed. He is not ill-appearing.  HENT:     Head: Normocephalic and atraumatic.     Right Ear: External ear normal.     Left Ear: External ear normal.     Nose: No congestion.  Eyes:     Extraocular Movements: Extraocular movements intact.     Conjunctiva/sclera: Conjunctivae normal.     Pupils: Pupils are equal, round, and reactive to light.  Cardiovascular:     Rate and Rhythm: Normal rate.     Pulses: Normal pulses.     Heart sounds: Normal heart sounds.  Pulmonary:     Effort: Pulmonary effort is normal. No respiratory distress.  Abdominal:     General: There is no distension.     Palpations: Abdomen is soft.  Musculoskeletal:        General: No tenderness or signs of injury.     Cervical back: Normal range of motion and neck supple. No rigidity.     Right lower leg: No edema.     Left lower leg: No edema.     Comments: Patient has good distal strength without clonus.  Skin:    General: Skin is warm and dry.     Findings: No erythema or rash.  Neurological:     General: No focal deficit present.  Mental Status: He is alert and oriented to person, place, and time.     Sensory: No sensory deficit.     Motor: No weakness or abnormal muscle tone.     Coordination: Coordination normal.     Gait: Gait normal.  Psychiatric:        Mood and Affect: Mood normal.        Behavior: Behavior normal.      Imaging: No results found.

## 2021-12-23 NOTE — Procedures (Signed)
Lumbar Epidural Steroid Injection - Interlaminar Approach with Fluoroscopic Guidance  Patient: Wesley Jackson      Date of Birth: 10-Nov-1940 MRN: [id number] PCP: Angelina Sheriff, MD      Visit Date: 12/10/2021   Universal Protocol:     Consent Given By: the patient  Position: PRONE  Additional Comments: Vital signs were monitored before and after the procedure. Patient was prepped and draped in the usual sterile fashion. The correct patient, procedure, and site was verified.   Injection Procedure Details:   Procedure diagnoses: Lumbar radiculopathy [M54.16]   Meds Administered:  Meds ordered this encounter  Medications   methylPREDNISolone acetate (DEPO-MEDROL) injection 40 mg     Laterality: Left  Location/Site:  L4-5  Needle: 3.5 in., 20 ga. Tuohy  Needle Placement: Paramedian epidural  Findings:   -Comments: Excellent flow of contrast into the epidural space.  Procedure Details: Using a paramedian approach from the side mentioned above, the region overlying the inferior lamina was localized under fluoroscopic visualization and the soft tissues overlying this structure were infiltrated with 4 ml. of 1% Lidocaine without Epinephrine. The Tuohy needle was inserted into the epidural space using a paramedian approach.   The epidural space was localized using loss of resistance along with counter oblique bi-planar fluoroscopic views.  After negative aspirate for air, blood, and CSF, a 2 ml. volume of Isovue-250 was injected into the epidural space and the flow of contrast was observed. Radiographs were obtained for documentation purposes.    The injectate was administered into the level noted above.   Additional Comments:  The patient tolerated the procedure well Dressing: 2 x 2 sterile gauze and Band-Aid    Post-procedure details: Patient was observed during the procedure. Post-procedure instructions were reviewed.  Patient left the clinic in stable  condition.

## 2022-01-05 ENCOUNTER — Telehealth: Payer: Self-pay | Admitting: Physical Medicine and Rehabilitation

## 2022-01-05 DIAGNOSIS — E785 Hyperlipidemia, unspecified: Secondary | ICD-10-CM | POA: Diagnosis not present

## 2022-01-05 DIAGNOSIS — I493 Ventricular premature depolarization: Secondary | ICD-10-CM | POA: Diagnosis not present

## 2022-01-05 DIAGNOSIS — I2510 Atherosclerotic heart disease of native coronary artery without angina pectoris: Secondary | ICD-10-CM | POA: Diagnosis not present

## 2022-01-05 DIAGNOSIS — D510 Vitamin B12 deficiency anemia due to intrinsic factor deficiency: Secondary | ICD-10-CM | POA: Diagnosis not present

## 2022-01-05 DIAGNOSIS — I10 Essential (primary) hypertension: Secondary | ICD-10-CM | POA: Diagnosis not present

## 2022-01-05 NOTE — Telephone Encounter (Signed)
Patient's wife [name] called needing to schedule an appointment for patient;s lower back. [name] said the epidural did not work. The number to contact  patient is [phone]

## 2022-01-06 LAB — AST: AST: 20 IU/L (ref 0–40)

## 2022-01-06 LAB — LIPID PANEL
Chol/HDL Ratio: 2.2 ratio (ref 0.0–5.0)
Cholesterol, Total: 97 mg/dL — ABNORMAL LOW (ref 100–199)
HDL: 45 mg/dL (ref >39)
LDL Chol Calc (NIH): 39 mg/dL (ref 0–99)
Triglycerides: 58 mg/dL (ref 0–149)
VLDL Cholesterol Cal: 13 mg/dL (ref 5–40)

## 2022-01-06 LAB — SPECIMEN STATUS REPORT

## 2022-01-06 LAB — ALT: ALT: 25 IU/L (ref 0–44)

## 2022-01-06 NOTE — Telephone Encounter (Signed)
OV scheduled.  °

## 2022-01-07 ENCOUNTER — Ambulatory Visit (INDEPENDENT_AMBULATORY_CARE_PROVIDER_SITE_OTHER): Payer: PPO | Admitting: Physical Medicine and Rehabilitation

## 2022-01-07 ENCOUNTER — Encounter: Payer: Self-pay | Admitting: Physical Medicine and Rehabilitation

## 2022-01-07 DIAGNOSIS — M47816 Spondylosis without myelopathy or radiculopathy, lumbar region: Secondary | ICD-10-CM | POA: Diagnosis not present

## 2022-01-07 DIAGNOSIS — M545 Low back pain, unspecified: Secondary | ICD-10-CM | POA: Diagnosis not present

## 2022-01-07 DIAGNOSIS — G8929 Other chronic pain: Secondary | ICD-10-CM

## 2022-01-07 NOTE — Progress Notes (Signed)
After having the epidural injection he had some relief but some pain returned he decided he needed to follow up.  Pain level today is mostly when he is getting up and down sitting is no pain  Bp 116/69 O2 96  Pr 63

## 2022-01-07 NOTE — Progress Notes (Unsigned)
Wesley Jackson - 81 y.o. male MRN [id number]  Date of birth: October 02, 1940  Office Visit Note: Visit Date: 01/07/2022 PCP: Angelina Sheriff, MD Referred by: Angelina Sheriff, MD  Subjective: Chief Complaint  Patient presents with   Lower Back - Pain   HPI: Wesley Jackson is a 81 y.o. male who comes in today for evaluation of chronic, worsening and severe bilateral lower back pain, left greater than right. Pain ongoing for several years and is exacerbated by movement, activity, twisting and bending. He describes pain as sore and aching in nature, currently rates as 7 out of 10. Some relief of pain with home exercise regimen, rest and use of medications. Patient has attended formal physical therapy in the past, feels these treatments were not beneficial in alleviating pain. Patients recent lumbar MRI imaging exhibits new disc protrusion at L3-L4  effacing the right lateral recess and contacting the descending right L4 nerve roots. Right lateral disc protrusion remains at L4-L5 and is slightly progressed from prior exam. No high grade spinal canal stenosis noted. Patient has a history of multiple transforaminal epidural steroid injections at the level of L3 and L4 performed in our office over the years with good relief of pain. Patient recently underwent left L4-L5 interlaminar epidural steroid injection in our office and reports some relief with this procedure. Patient states lower back pain does continue, reports he is not able to swing golf club due to severe pain with twisting motion. Patient denies focal weakness, numbness and tingling. Patient denies recent trauma or falls.    Review of Systems  Musculoskeletal:  Positive for back pain.  Neurological:  Negative for tingling, sensory change, focal weakness and weakness.  All other systems reviewed and are negative.  Otherwise per HPI.  Assessment & Plan: Visit Diagnoses:    ICD-10-CM   1. Chronic bilateral low back pain without sciatica   M54.50    G89.29     2. Spondylosis without myelopathy or radiculopathy, lumbar region  M47.816     3. Facet hypertrophy of lumbar region  M47.816        Plan: Findings:  Chronic, worsening and severe bilateral axial back pain, left greater than right. Patient continues to have severe pain despite good conservative therapies such as formal physical therapy, home exercise regimen, rest and use of medications. Patients clinical presentation and exam are consistent with facet mediated pain. Next step is to perform diagnostic and hopefully therapeutic bilateral L4-L5 and L5-S1 facet joint injections under fluoroscopic guidance. If good relief of pain with diagnotic facet injections we did discuss the possibility of longer sustained pain relief with radiofrequency ablation. No red flag symptoms noted upon exam today.     Meds & Orders: No orders of the defined types were placed in this encounter.  No orders of the defined types were placed in this encounter.   Follow-up: Return for Bilateral L4-L5 and L5-S1 facet joint injections.   Procedures: No procedures performed      Clinical History: EXAM: MRI LUMBAR SPINE WITHOUT CONTRAST   TECHNIQUE: Multiplanar, multisequence MR imaging of the lumbar spine was performed. No intravenous contrast was administered.   COMPARISON:  11/25/2018   FINDINGS: Segmentation:  Standard.   Alignment:  Trace retrolisthesis L2 on L3.   Vertebrae:  No fracture, evidence of discitis, or bone lesion.   Conus medullaris and cauda equina: Conus extends to the L1 level. Conus and cauda equina appear normal.   Paraspinal and other  soft tissues: Negative.   Disc levels:   T12-L1: No significant disc bulge. No spinal canal stenosis or neural foraminal narrowing.   L1-L2: Looks field minimal disc bulge. Mild facet arthropathy. No spinal canal stenosis or neural foraminal narrowing.   L2-L3: Trace retrolisthesis. Mild disc bulge. Mild  facet arthropathy. No spinal canal stenosis or neural foraminal narrowing.   L3-L4: Mild disc bulge with right subarticular and foraminal disc protrusion, new from the prior exam, which effaces the right lateral recess and contacts the descending right L4 nerve roots. No spinal canal stenosis. Mild left and moderate right neural foraminal narrowing, which have progressed slightly from the prior exam.   L4-L5: Mild disc bulge with slightly increased right foraminal and extreme lateral disc protrusion, which displaces the exiting right L4 nerve roots, slightly progressed from the prior exam. Narrowing of the lateral recesses. Mild facet arthropathy. No spinal canal stenosis. Moderate right neural foraminal narrowing.   L5-S1: Mild disc bulge. Mild facet arthropathy. No spinal canal stenosis. Mild left neural foraminal narrowing, unchanged.   IMPRESSION: 1. L3-L4 disc protrusion effaces the right lateral recess and contacts the descending right L4 nerve roots, new from the prior exam. Moderate right and mild left neural foraminal narrowing at this level have progressed slightly from the prior exam. 2. L4-L5 right extreme lateral disc protrusion displaces the exiting right L4 nerve roots slightly progressed from the prior exam. Unchanged moderate right neural foraminal narrowing. Narrowing of the lateral recesses at this level could affect the descending L5 nerve roots. 3. L5-S1 mild left neural foraminal narrowing, unchanged.     Electronically Signed   By: Merilyn Baba M.D.   On: 11/16/2021 11:31   He reports that he has never smoked. He has never used smokeless tobacco. No results for input(s): "HGBA1C", "LABURIC" in the last 8760 hours.  Objective:  VS:  HT:    WT:   BMI:     BP:   HR: bpm  TEMP: ( )  RESP:  Physical Exam Vitals and nursing note reviewed.  HENT:     Head: Normocephalic and atraumatic.     Right Ear: External ear normal.     Left Ear: External ear  normal.     Nose: Nose normal.     Mouth/Throat:     Mouth: Mucous membranes are moist.  Eyes:     Extraocular Movements: Extraocular movements intact.  Cardiovascular:     Rate and Rhythm: Normal rate.     Pulses: Normal pulses.  Pulmonary:     Effort: Pulmonary effort is normal.  Abdominal:     General: Abdomen is flat. There is no distension.  Musculoskeletal:        General: Tenderness present.     Cervical back: Normal range of motion.     Comments: Pt is slow to rise from seated position to standing. Concordant low back pain with facet loading, lumbar spine extension and rotation. Strong distal strength without clonus, no pain upon palpation of greater trochanters. Sensation intact bilaterally. Walks independently, gait steady.   Skin:    General: Skin is warm and dry.     Capillary Refill: Capillary refill takes less than 2 seconds.  Neurological:     General: No focal deficit present.     Mental Status: He is alert and oriented to person, place, and time.  Psychiatric:        Mood and Affect: Mood normal.        Behavior: Behavior normal.  Ortho Exam  Imaging: No results found.  Past Medical/Family/Surgical/Social History: Medications & Allergies reviewed per EMR, new medications updated. Patient Active Problem List   Diagnosis Date Noted   Coronary artery disease 50 to 69% stenosis of the mid LAD however FFR negative based on coronary CT angio 11/24/2021   PVC's (premature ventricular contractions) 11/24/2021   Eye problem 11/20/2021   Hypertension 11/20/2021   Cellulitis 10/30/2021   Open wound of lower extremity 10/30/2021   Atypical chest pain 09/22/2021   Dyslipidemia 09/22/2021   Skin sloughing ([place]) 12/02/2020   Erysipelas of left lower extremity 11/25/2020   Malignant neoplasm of prostate ([place]) 09/18/2019   Lumbar radiculopathy 11/28/2018   Hypertrophy of inferior nasal turbinate 01/21/2015   Nasal congestion with rhinorrhea 01/21/2015   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   Past Medical History:  Diagnosis Date   Arthritis    herniated disc lower back took injection 2017, occ lower back spams had epidural 2020   Atypical chest pain 09/22/2021   Cellulitis 10/30/2021   Dyslipidemia 09/22/2021   Dyspnea on exertion 09/22/2021   Eye problem 11/20/2021   GERD (gastroesophageal reflux disease)    Hypertension    Nasal congestion with rhinorrhea 01/21/2015   Open wound of lower extremity 10/30/2021   Prostate cancer ([place]) 2021   Family History  Problem Relation Age of Onset   COPD Mother    Diabetes Father    Heart attack Father    Prostate cancer Neg Hx    Colon cancer Neg Hx    Pancreatic cancer Neg Hx    Breast cancer Neg Hx    Past Surgical History:  Procedure Laterality Date   ACHILLES TENDON REPAIR Left yrs ago   ARTHROSCOPY KNEE W/ DRILLING Right yrs ago   CYSTOSCOPY N/A 12/20/2019   Procedure: CYSTOSCOPY FLEXIBLE;  Surgeon: Joie Bimler, MD;  Location: [facility];  Service: Urology;  Laterality: N/A;   EYE SURGERY Bilateral 2019   cataracts both eyes   HERNIA REPAIR Bilateral yrs ago   inguinal hernia    NASAL TURBINATE REDUCTION  05/2019   PROSTATE BIOPSY  2021   RADIOACTIVE SEED IMPLANT N/A 12/20/2019   Procedure: RADIOACTIVE SEED IMPLANT/BRACHYTHERAPY IMPLANT;  Surgeon: Joie Bimler, MD;  Location: [facility];  Service: Urology;  Laterality: N/A;   skin biopsies, nose  08/2019   SPACE OAR INSTILLATION N/A 12/20/2019   Procedure: SPACE OAR INSTILLATION;  Surgeon: Joie Bimler, MD;  Location[facility];  Service: Urology;  Laterality: N/A;   Social History   Occupational History   Not on file  Tobacco Use   Smoking status: Never   Smokeless tobacco: Never  Vaping Use   Vaping Use: Never used  Substance and Sexual Activity   Alcohol use: Yes    Alcohol/week: 1.0 standard drink of alcohol    Types: 1 Cans of beer per week    Comment: 1 beerr month   Drug  use: No   Sexual activity: Not Currently

## 2022-01-12 ENCOUNTER — Telehealth: Payer: Self-pay

## 2022-01-12 NOTE — Telephone Encounter (Signed)
Results reviewed with pt as per Dr. Krasowski's note.  Pt verbalized understanding and had no additional questions. Routed to PCP  

## 2022-01-15 ENCOUNTER — Other Ambulatory Visit: Payer: Self-pay

## 2022-01-15 MED ORDER — ROSUVASTATIN CALCIUM 20 MG PO TABS: 20.0000 mg | ORAL_TABLET | Freq: Every day | ORAL | 0 refills | Status: DC

## 2022-01-19 DIAGNOSIS — J329 Chronic sinusitis, unspecified: Secondary | ICD-10-CM | POA: Diagnosis not present

## 2022-01-19 DIAGNOSIS — J302 Other seasonal allergic rhinitis: Secondary | ICD-10-CM | POA: Diagnosis not present

## 2022-01-19 DIAGNOSIS — J40 Bronchitis, not specified as acute or chronic: Secondary | ICD-10-CM | POA: Diagnosis not present

## 2022-01-25 ENCOUNTER — Encounter: Payer: PPO | Admitting: Physical Medicine and Rehabilitation

## 2022-01-25 ENCOUNTER — Telehealth: Payer: Self-pay | Admitting: Physical Medicine and Rehabilitation

## 2022-01-25 NOTE — Telephone Encounter (Signed)
Pt's wife called stating they didn't realized appt was at 10:15 with Dr. Ernestina Patches and asking to come in later today. Please call back at [phone].

## 2022-01-25 NOTE — Telephone Encounter (Signed)
IC rescheduled. They will check back with [place] for cancellations as well.

## 2022-01-26 ENCOUNTER — Telehealth: Payer: Self-pay | Admitting: Physical Medicine and Rehabilitation

## 2022-01-26 NOTE — Telephone Encounter (Signed)
IC to advise no cancellations at this time. No answer. LMVM

## 2022-01-26 NOTE — Telephone Encounter (Signed)
Patient's wife called. Would like to know if anything came available this week for him to see Dr. Ernestina Patches. [phone]

## 2022-02-03 DIAGNOSIS — C441291 Squamous cell carcinoma of skin of left upper eyelid, including canthus: Secondary | ICD-10-CM | POA: Diagnosis not present

## 2022-02-03 DIAGNOSIS — L570 Actinic keratosis: Secondary | ICD-10-CM | POA: Diagnosis not present

## 2022-02-08 ENCOUNTER — Ambulatory Visit (INDEPENDENT_AMBULATORY_CARE_PROVIDER_SITE_OTHER): Payer: PPO | Admitting: Physical Medicine and Rehabilitation

## 2022-02-08 ENCOUNTER — Ambulatory Visit: Payer: Self-pay

## 2022-02-08 VITALS — BP 165/78 | HR 75

## 2022-02-08 DIAGNOSIS — M47816 Spondylosis without myelopathy or radiculopathy, lumbar region: Secondary | ICD-10-CM

## 2022-02-08 MED ORDER — BUPIVACAINE HCL 0.5 % IJ SOLN
3.0000 mL | Freq: Once | INTRAMUSCULAR | Status: AC
  Administered 2022-02-08: 3 mL

## 2022-02-08 NOTE — Progress Notes (Signed)
Numeric Pain Rating Scale and Functional Assessment Average Pain 7   In the last MONTH (on 0-10 scale) has pain interfered with the following?  1. General activity like being  able to carry out your everyday physical activities such as walking, climbing stairs, carrying groceries, or moving a chair?  Rating(5)   +Driver, -BT, -Dye Allergies.  Sitting can be difficult to get back up. No radiation to legs. Takes Tylenol for pain

## 2022-02-08 NOTE — Patient Instructions (Signed)

## 2022-02-09 ENCOUNTER — Other Ambulatory Visit: Payer: Self-pay | Admitting: Physical Medicine and Rehabilitation

## 2022-02-09 ENCOUNTER — Telehealth: Payer: Self-pay | Admitting: Physical Medicine and Rehabilitation

## 2022-02-09 DIAGNOSIS — M47816 Spondylosis without myelopathy or radiculopathy, lumbar region: Secondary | ICD-10-CM

## 2022-02-09 DIAGNOSIS — M545 Low back pain, unspecified: Secondary | ICD-10-CM

## 2022-02-09 NOTE — Telephone Encounter (Signed)
Wesley Jackson had a shot in his back on 02/08/2022, per his wife, patient is not any better states his right side is hurting worse

## 2022-02-17 DIAGNOSIS — E538 Deficiency of other specified B group vitamins: Secondary | ICD-10-CM | POA: Diagnosis not present

## 2022-02-21 NOTE — Procedures (Signed)
Lumbar Diagnostic Facet Joint Nerve Block with Fluoroscopic Guidance   Patient: Wesley Jackson      Date of Birth: 29-Jan-1941 MRN: [id number] PCP: Angelina Sheriff, MD      Visit Date: 02/08/2022   Universal Protocol:    Date/Time: 02/21/2309:45 AM  Consent Given By: the patient  Position: PRONE  Additional Comments: Vital signs were monitored before and after the procedure. Patient was prepped and draped in the usual sterile fashion. The correct patient, procedure, and site was verified.   Injection Procedure Details:   Procedure diagnoses:  1. Spondylosis without myelopathy or radiculopathy, lumbar region      Meds Administered:  Meds ordered this encounter  Medications   bupivacaine (MARCAINE) 0.5 % (with pres) injection 3 mL     Laterality: Bilateral  Location/Site: L4-L5, L3 and L4 medial branches and L5-S1, L4 medial branch and L5 dorsal ramus  Needle: 5.0 in., 25 ga.  Short bevel or Quincke spinal needle  Needle Placement: Oblique pedical  Findings:   -Comments: There was excellent flow of contrast along the articular pillars without intravascular flow.  Procedure Details: The fluoroscope beam is vertically oriented in AP and then obliqued 15 to 20 degrees to the ipsilateral side of the desired nerve to achieve the "Scotty dog" appearance.  The skin over the target area of the junction of the superior articulating process and the transverse process (sacral ala if blocking the L5 dorsal rami) was locally anesthetized with a 1 ml volume of 1% Lidocaine without Epinephrine.  The spinal needle was inserted and advanced in a trajectory view down to the target.   After contact with periosteum and negative aspirate for blood and CSF, correct placement without intravascular or epidural spread was confirmed by injecting 0.5 ml. of Isovue-250.  A spot radiograph was obtained of this image.    Next, a 0.5 ml. volume of the injectate described above was injected. The  needle was then redirected to the other facet joint nerves mentioned above if needed.  Prior to the procedure, the patient was given a Pain Diary which was completed for baseline measurements.  After the procedure, the patient rated their pain every 30 minutes and will continue rating at this frequency for a total of 5 hours.  The patient has been asked to complete the Diary and return to [place] by mail, fax or hand delivered as soon as possible.   Additional Comments:  The patient tolerated the procedure well Dressing: 2 x 2 sterile gauze and Band-Aid    Post-procedure details: Patient was observed during the procedure. Post-procedure instructions were reviewed.  Patient left the clinic in stable condition.

## 2022-02-21 NOTE — Progress Notes (Signed)
Wesley Jackson - 81 y.o. male MRN [id number]  Date of birth: 10/21/1940  Office Visit Note: Visit Date: 02/08/2022 PCP: Angelina Sheriff, MD Referred by: Angelina Sheriff, MD  Subjective: Chief Complaint  Patient presents with   Lower Back - Pain   HPI:  Wesley Jackson is a 81 y.o. male who comes in today at the request of Barnet Pall, FNP for planned Bilateral  L4-5 and L5-S1 Lumbar facet/medial branch block with fluoroscopic guidance.  The patient has failed conservative care including home exercise, medications, time and activity modification.  This injection will be diagnostic and hopefully therapeutic.  Please see requesting physician notes for further details and justification.  Exam has shown concordant pain with facet joint loading.   ROS Otherwise per HPI.  Assessment & Plan: Visit Diagnoses:    ICD-10-CM   1. Spondylosis without myelopathy or radiculopathy, lumbar region  M47.816 XR C-ARM NO REPORT    Facet Injection    bupivacaine (MARCAINE) 0.5 % (with pres) injection 3 mL      Plan: No additional findings.   Meds & Orders:  Meds ordered this encounter  Medications   bupivacaine (MARCAINE) 0.5 % (with pres) injection 3 mL    Orders Placed This Encounter  Procedures   Facet Injection   XR C-ARM NO REPORT    Follow-up: Return for Review Pain Diary.   Procedures: No procedures performed  Lumbar Diagnostic Facet Joint Nerve Block with Fluoroscopic Guidance   Patient: Wesley Jackson      Date of Birth: 04-Mar-1941 MRN: [id number] PCP: Angelina Sheriff, MD      Visit Date: 02/08/2022   Universal Protocol:    Date/Time: 02/21/2309:45 AM  Consent Given By: the patient  Position: PRONE  Additional Comments: Vital signs were monitored before and after the procedure. Patient was prepped and draped in the usual sterile fashion. The correct patient, procedure, and site was verified.   Injection Procedure Details:   Procedure diagnoses:  1.  Spondylosis without myelopathy or radiculopathy, lumbar region      Meds Administered:  Meds ordered this encounter  Medications   bupivacaine (MARCAINE) 0.5 % (with pres) injection 3 mL     Laterality: Bilateral  Location/Site: L4-L5, L3 and L4 medial branches and L5-S1, L4 medial branch and L5 dorsal ramus  Needle: 5.0 in., 25 ga.  Short bevel or Quincke spinal needle  Needle Placement: Oblique pedical  Findings:   -Comments: There was excellent flow of contrast along the articular pillars without intravascular flow.  Procedure Details: The fluoroscope beam is vertically oriented in AP and then obliqued 15 to 20 degrees to the ipsilateral side of the desired nerve to achieve the "Scotty dog" appearance.  The skin over the target area of the junction of the superior articulating process and the transverse process (sacral ala if blocking the L5 dorsal rami) was locally anesthetized with a 1 ml volume of 1% Lidocaine without Epinephrine.  The spinal needle was inserted and advanced in a trajectory view down to the target.   After contact with periosteum and negative aspirate for blood and CSF, correct placement without intravascular or epidural spread was confirmed by injecting 0.5 ml. of Isovue-250.  A spot radiograph was obtained of this image.    Next, a 0.5 ml. volume of the injectate described above was injected. The needle was then redirected to the other facet joint nerves mentioned above if needed.  Prior to the procedure, the  patient was given a Pain Diary which was completed for baseline measurements.  After the procedure, the patient rated their pain every 30 minutes and will continue rating at this frequency for a total of 5 hours.  The patient has been asked to complete the Diary and return to [place] by mail, fax or hand delivered as soon as possible.   Additional Comments:  The patient tolerated the procedure well Dressing: 2 x 2 sterile gauze and Band-Aid     Post-procedure details: Patient was observed during the procedure. Post-procedure instructions were reviewed.  Patient left the clinic in stable condition.   Clinical History: EXAM: MRI LUMBAR SPINE WITHOUT CONTRAST   TECHNIQUE: Multiplanar, multisequence MR imaging of the lumbar spine was performed. No intravenous contrast was administered.   COMPARISON:  11/25/2018   FINDINGS: Segmentation:  Standard.   Alignment:  Trace retrolisthesis L2 on L3.   Vertebrae:  No fracture, evidence of discitis, or bone lesion.   Conus medullaris and cauda equina: Conus extends to the L1 level. Conus and cauda equina appear normal.   Paraspinal and other soft tissues: Negative.   Disc levels:   T12-L1: No significant disc bulge. No spinal canal stenosis or neural foraminal narrowing.   L1-L2: Looks field minimal disc bulge. Mild facet arthropathy. No spinal canal stenosis or neural foraminal narrowing.   L2-L3: Trace retrolisthesis. Mild disc bulge. Mild facet arthropathy. No spinal canal stenosis or neural foraminal narrowing.   L3-L4: Mild disc bulge with right subarticular and foraminal disc protrusion, new from the prior exam, which effaces the right lateral recess and contacts the descending right L4 nerve roots. No spinal canal stenosis. Mild left and moderate right neural foraminal narrowing, which have progressed slightly from the prior exam.   L4-L5: Mild disc bulge with slightly increased right foraminal and extreme lateral disc protrusion, which displaces the exiting right L4 nerve roots, slightly progressed from the prior exam. Narrowing of the lateral recesses. Mild facet arthropathy. No spinal canal stenosis. Moderate right neural foraminal narrowing.   L5-S1: Mild disc bulge. Mild facet arthropathy. No spinal canal stenosis. Mild left neural foraminal narrowing, unchanged.   IMPRESSION: 1. L3-L4 disc protrusion effaces the right lateral recess and contacts  the descending right L4 nerve roots, new from the prior exam. Moderate right and mild left neural foraminal narrowing at this level have progressed slightly from the prior exam. 2. L4-L5 right extreme lateral disc protrusion displaces the exiting right L4 nerve roots slightly progressed from the prior exam. Unchanged moderate right neural foraminal narrowing. Narrowing of the lateral recesses at this level could affect the descending L5 nerve roots. 3. L5-S1 mild left neural foraminal narrowing, unchanged.     Electronically Signed   By: Merilyn Baba M.D.   On: 11/16/2021 11:31     Objective:  VS:  HT:    WT:   BMI:     BP:(!) 165/78  HR:75bpm  TEMP: ( )  RESP:  Physical Exam Vitals and nursing note reviewed.  Constitutional:      General: He is not in acute distress.    Appearance: Normal appearance. He is not ill-appearing.  HENT:     Head: Normocephalic and atraumatic.     Right Ear: External ear normal.     Left Ear: External ear normal.     Nose: No congestion.  Eyes:     Extraocular Movements: Extraocular movements intact.  Cardiovascular:     Rate and Rhythm: Normal rate.  Pulses: Normal pulses.  Pulmonary:     Effort: Pulmonary effort is normal. No respiratory distress.  Abdominal:     General: There is no distension.     Palpations: Abdomen is soft.  Musculoskeletal:        General: No tenderness or signs of injury.     Cervical back: Neck supple.     Right lower leg: No edema.     Left lower leg: No edema.     Comments: Patient has good distal strength without clonus.  Skin:    Findings: No erythema or rash.  Neurological:     General: No focal deficit present.     Mental Status: He is alert and oriented to person, place, and time.     Sensory: No sensory deficit.     Motor: No weakness or abnormal muscle tone.     Coordination: Coordination normal.  Psychiatric:        Mood and Affect: Mood normal.        Behavior: Behavior normal.       Imaging: No results found.

## 2022-02-24 DIAGNOSIS — Z79899 Other long term (current) drug therapy: Secondary | ICD-10-CM | POA: Diagnosis not present

## 2022-02-24 DIAGNOSIS — R531 Weakness: Secondary | ICD-10-CM | POA: Diagnosis not present

## 2022-02-24 DIAGNOSIS — M1990 Unspecified osteoarthritis, unspecified site: Secondary | ICD-10-CM | POA: Diagnosis not present

## 2022-02-24 DIAGNOSIS — R200 Anesthesia of skin: Secondary | ICD-10-CM | POA: Diagnosis not present

## 2022-02-24 DIAGNOSIS — Z8616 Personal history of COVID-19: Secondary | ICD-10-CM | POA: Diagnosis not present

## 2022-02-24 DIAGNOSIS — R5383 Other fatigue: Secondary | ICD-10-CM | POA: Diagnosis not present

## 2022-02-24 DIAGNOSIS — I6523 Occlusion and stenosis of bilateral carotid arteries: Secondary | ICD-10-CM | POA: Diagnosis not present

## 2022-02-24 DIAGNOSIS — R2990 Unspecified symptoms and signs involving the nervous system: Secondary | ICD-10-CM | POA: Diagnosis not present

## 2022-02-24 DIAGNOSIS — R9431 Abnormal electrocardiogram [ECG] [EKG]: Secondary | ICD-10-CM | POA: Diagnosis not present

## 2022-02-24 DIAGNOSIS — I10 Essential (primary) hypertension: Secondary | ICD-10-CM | POA: Diagnosis not present

## 2022-02-24 DIAGNOSIS — R202 Paresthesia of skin: Secondary | ICD-10-CM | POA: Diagnosis not present

## 2022-02-24 DIAGNOSIS — I340 Nonrheumatic mitral (valve) insufficiency: Secondary | ICD-10-CM | POA: Diagnosis not present

## 2022-02-24 DIAGNOSIS — Z7982 Long term (current) use of aspirin: Secondary | ICD-10-CM | POA: Diagnosis not present

## 2022-02-24 DIAGNOSIS — E785 Hyperlipidemia, unspecified: Secondary | ICD-10-CM | POA: Diagnosis not present

## 2022-02-24 DIAGNOSIS — R29810 Facial weakness: Secondary | ICD-10-CM | POA: Diagnosis not present

## 2022-02-24 DIAGNOSIS — Z23 Encounter for immunization: Secondary | ICD-10-CM | POA: Diagnosis not present

## 2022-02-25 DIAGNOSIS — R202 Paresthesia of skin: Secondary | ICD-10-CM | POA: Diagnosis not present

## 2022-02-25 DIAGNOSIS — R2990 Unspecified symptoms and signs involving the nervous system: Secondary | ICD-10-CM | POA: Diagnosis not present

## 2022-02-25 DIAGNOSIS — Z8616 Personal history of COVID-19: Secondary | ICD-10-CM | POA: Diagnosis not present

## 2022-03-04 DIAGNOSIS — Z8673 Personal history of transient ischemic attack (TIA), and cerebral infarction without residual deficits: Secondary | ICD-10-CM | POA: Diagnosis not present

## 2022-03-04 DIAGNOSIS — I10 Essential (primary) hypertension: Secondary | ICD-10-CM | POA: Diagnosis not present

## 2022-03-04 DIAGNOSIS — G252 Other specified forms of tremor: Secondary | ICD-10-CM | POA: Diagnosis not present

## 2022-03-04 DIAGNOSIS — Z6828 Body mass index (BMI) 28.0-28.9, adult: Secondary | ICD-10-CM | POA: Diagnosis not present

## 2022-03-08 DIAGNOSIS — D485 Neoplasm of uncertain behavior of skin: Secondary | ICD-10-CM | POA: Diagnosis not present

## 2022-03-08 DIAGNOSIS — D1801 Hemangioma of skin and subcutaneous tissue: Secondary | ICD-10-CM | POA: Diagnosis not present

## 2022-03-08 DIAGNOSIS — L578 Other skin changes due to chronic exposure to nonionizing radiation: Secondary | ICD-10-CM | POA: Diagnosis not present

## 2022-03-08 DIAGNOSIS — L570 Actinic keratosis: Secondary | ICD-10-CM | POA: Diagnosis not present

## 2022-03-08 DIAGNOSIS — E663 Overweight: Secondary | ICD-10-CM | POA: Diagnosis not present

## 2022-03-08 DIAGNOSIS — L821 Other seborrheic keratosis: Secondary | ICD-10-CM | POA: Diagnosis not present

## 2022-03-09 ENCOUNTER — Encounter: Payer: Self-pay | Admitting: Neurology

## 2022-03-11 ENCOUNTER — Telehealth: Payer: Self-pay | Admitting: Physical Medicine and Rehabilitation

## 2022-03-11 NOTE — Telephone Encounter (Signed)
Pt wife called in requesting appt for cortisone injection in the left hip... Pt requesting callback.Marland KitchenMarland KitchenMarland Kitchen

## 2022-03-12 NOTE — Telephone Encounter (Signed)
noted 

## 2022-03-24 ENCOUNTER — Ambulatory Visit (INDEPENDENT_AMBULATORY_CARE_PROVIDER_SITE_OTHER): Payer: PPO

## 2022-03-24 ENCOUNTER — Encounter: Payer: Self-pay | Admitting: Orthopaedic Surgery

## 2022-03-24 ENCOUNTER — Ambulatory Visit (INDEPENDENT_AMBULATORY_CARE_PROVIDER_SITE_OTHER): Payer: PPO | Admitting: Orthopaedic Surgery

## 2022-03-24 DIAGNOSIS — M25552 Pain in left hip: Secondary | ICD-10-CM

## 2022-03-24 DIAGNOSIS — M47816 Spondylosis without myelopathy or radiculopathy, lumbar region: Secondary | ICD-10-CM | POA: Insufficient documentation

## 2022-03-24 MED ORDER — METHYLPREDNISOLONE 4 MG PO TBPK: ORAL_TABLET | ORAL | 0 refills | Status: DC

## 2022-03-24 NOTE — Progress Notes (Signed)
Office Visit Note   Patient: Wesley Jackson           Date of Birth: November 25, 1940           MRN: [id number] Visit Date: 03/24/2022              Requested by: Angelina Sheriff, MD [place],  [place] [id number] PCP: Angelina Sheriff, MD   Assessment & Plan: Visit Diagnoses:  1. Pain of left hip   2. Spondylosis of lumbar region without myelopathy or radiculopathy     Plan: Wesley Jackson has been experiencing pain in the area of his left buttock for approximately 3 weeks.  No history of injury or trauma.  He has been playing quite a bit of golf and is in the midst of downsizing and moving.  He is done a lot of repetitive activity.  He does have a history of low back pain and is seeing Dr. Ernestina Patches on multiple occasions for epidural steroid injections.  He also had an MRI scan this year ago that demonstrated degenerative changes lumbar spine and possible impingement on the right side.  He is totally asymptomatic on the right.  He has difficulty getting up from a sitting position because of pain in the area of the left buttock. I going to place him on a Medrol Dosepak and then have him contact me within the next week if no improvement.  Would like Dr. Ernestina Patches to see him at that point to consider injecting the left side facet joints at   Either L4-5 and/or L5-S1  Follow-Up Instructions: Return if symptoms worsen or fail to improve.   Orders:  Orders Placed This Encounter  Procedures   XR Pelvis 1-2 Views   Meds ordered this encounter  Medications   methylPREDNISolone (MEDROL DOSEPAK) 4 MG TBPK tablet    Sig: Take as directed with food    Dispense:  21 tablet    Refill:  0      Procedures: No procedures performed   Clinical Data: No additional findings.   Subjective: Chief Complaint  Patient presents with   Left Hip - Pain  Wesley Jackson relates insidious onset of left buttock pain approximately 3 weeks ago.  No history of injury or trauma.  He is in the midst of  moving and has been performing a lot of repetitive bending stooping and squatting as well as lifting.  He has not had any groin pain or lateral hip pain but predominant buttock pain when he is up and about.  He cannot localize his pain to palpation.  He does have a history of arthritis of the lumbar spine.  He had a recent MRI scan demonstrating impingement at several levels on the right but no right-sided symptoms on this occasion.  Dr. Ernestina Patches has performed epidural steroid injections in the past with good relief.  Again, he does not feel like his present pain is similar to that which she has had in the past.  He cannot localize the pain to palpation but it seems to be worse when he gets up from a sitting position or he does a lot of bending.  HPI  Review of Systems   Objective: Vital Signs: There were no vitals taken for this visit.  Physical Exam Constitutional:      Appearance: He is well-developed.  Pulmonary:     Effort: Pulmonary effort is normal.  Skin:    General: Skin is warm and dry.  Neurological:     Mental Status: He is alert and oriented to person, place, and time.  Psychiatric:        Behavior: Behavior normal.     Ortho Exam awake alert and oriented x3.  Comfortable sitting.  Painless range of motion of left hip with internal and external rotation.  He was "a little sore" over the greater trochanter left hip but not painful.  I could not elicit any pain in the area of his buttock or ischial tuberosity or SI joint on the left.  No percussible tenderness of the lumbar spine.  Straight leg raise negative. he does have a little bit of a pill-rolling tremor  Specialty Comments:  EXAM: MRI LUMBAR SPINE WITHOUT CONTRAST   TECHNIQUE: Multiplanar, multisequence MR imaging of the lumbar spine was performed. No intravenous contrast was administered.   COMPARISON:  11/25/2018   FINDINGS: Segmentation:  Standard.   Alignment:  Trace retrolisthesis L2 on L3.   Vertebrae:   No fracture, evidence of discitis, or bone lesion.   Conus medullaris and cauda equina: Conus extends to the L1 level. Conus and cauda equina appear normal.   Paraspinal and other soft tissues: Negative.   Disc levels:   T12-L1: No significant disc bulge. No spinal canal stenosis or neural foraminal narrowing.   L1-L2: Looks field minimal disc bulge. Mild facet arthropathy. No spinal canal stenosis or neural foraminal narrowing.   L2-L3: Trace retrolisthesis. Mild disc bulge. Mild facet arthropathy. No spinal canal stenosis or neural foraminal narrowing.   L3-L4: Mild disc bulge with right subarticular and foraminal disc protrusion, new from the prior exam, which effaces the right lateral recess and contacts the descending right L4 nerve roots. No spinal canal stenosis. Mild left and moderate right neural foraminal narrowing, which have progressed slightly from the prior exam.   L4-L5: Mild disc bulge with slightly increased right foraminal and extreme lateral disc protrusion, which displaces the exiting right L4 nerve roots, slightly progressed from the prior exam. Narrowing of the lateral recesses. Mild facet arthropathy. No spinal canal stenosis. Moderate right neural foraminal narrowing.   L5-S1: Mild disc bulge. Mild facet arthropathy. No spinal canal stenosis. Mild left neural foraminal narrowing, unchanged.   IMPRESSION: 1. L3-L4 disc protrusion effaces the right lateral recess and contacts the descending right L4 nerve roots, new from the prior exam. Moderate right and mild left neural foraminal narrowing at this level have progressed slightly from the prior exam. 2. L4-L5 right extreme lateral disc protrusion displaces the exiting right L4 nerve roots slightly progressed from the prior exam. Unchanged moderate right neural foraminal narrowing. Narrowing of the lateral recesses at this level could affect the descending L5 nerve roots. 3. L5-S1 mild left neural  foraminal narrowing, unchanged.     Electronically Signed   By: Merilyn Baba M.D.   On: 11/16/2021 11:31  Imaging: XR Pelvis 1-2 Views  Result Date: 03/24/2022 AP pelvis was obtained without evidence of obvious right or left hip pathology.  Joint spaces well-maintained.  No obvious subchondral sclerosis or cyst formation on the left side.  No irregularity about the greater trochanter or ectopic calcification.  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   Coronary artery disease 50 to 69% stenosis of the mid LAD however FFR negative based on coronary CT angio 11/24/2021   PVC's (premature ventricular contractions) 11/24/2021   Eye  problem 11/20/2021   Hypertension 11/20/2021   Cellulitis 10/30/2021   Open wound of lower extremity 10/30/2021   Atypical chest pain 09/22/2021   Dyslipidemia 09/22/2021   Skin sloughing ([facility]) 12/02/2020   Erysipelas of left lower extremity 11/25/2020   Malignant neoplasm of prostate ([facility]) 09/18/2019   Lumbar radiculopathy 11/28/2018   Hypertrophy of inferior nasal turbinate 01/21/2015   Nasal congestion with rhinorrhea 01/21/2015   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   Past Medical History:  Diagnosis Date   Arthritis    herniated disc lower back took injection 2017, occ lower back spams had epidural 2020   Atypical chest pain 09/22/2021   Cellulitis 10/30/2021   Dyslipidemia 09/22/2021   Dyspnea on exertion 09/22/2021   Eye problem 11/20/2021   GERD (gastroesophageal reflux disease)    Hypertension    Nasal congestion with rhinorrhea 01/21/2015   Open wound of lower extremity 10/30/2021   Prostate cancer ([facility]) 2021    Family History  Problem Relation Age of Onset   COPD Mother    Diabetes Father    Heart attack Father    Prostate cancer Neg Hx    Colon cancer Neg Hx    Pancreatic cancer Neg Hx    Breast cancer Neg Hx     Past Surgical  History:  Procedure Laterality Date   ACHILLES TENDON REPAIR Left yrs ago   ARTHROSCOPY KNEE W/ DRILLING Right yrs ago   CYSTOSCOPY N/A 12/20/2019   Procedure: CYSTOSCOPY FLEXIBLE;  Surgeon: Joie Bimler, MD;  Location: [facility];  Service: Urology;  Laterality: N/A;   EYE SURGERY Bilateral 2019   cataracts both eyes   HERNIA REPAIR Bilateral yrs ago   inguinal hernia    NASAL TURBINATE REDUCTION  05/2019   PROSTATE BIOPSY  2021   RADIOACTIVE SEED IMPLANT N/A 12/20/2019   Procedure: RADIOACTIVE SEED IMPLANT/BRACHYTHERAPY IMPLANT;  Surgeon: Joie Bimler, MD;  Location: [facility];  Service: Urology;  Laterality: N/A;   skin biopsies, nose  08/2019   SPACE OAR INSTILLATION N/A 12/20/2019   Procedure: SPACE OAR INSTILLATION;  Surgeon: Joie Bimler, MD;  Location: [facility];  Service: Urology;  Laterality: N/A;   Social History   Occupational History   Not on file  Tobacco Use   Smoking status: Never   Smokeless tobacco: Never  Vaping Use   Vaping Use: Never used  Substance and Sexual Activity   Alcohol use: Yes    Alcohol/week: 1.0 standard drink of alcohol    Types: 1 Cans of beer per week    Comment: 1 beerr month   Drug use: No   Sexual activity: Not Currently     Garald Balding, MD   Note - This record has been created using [employer].  Chart creation errors have been sought, but may not always  have been located. Such creation errors do not reflect on  the standard of medical care.

## 2022-04-12 ENCOUNTER — Other Ambulatory Visit: Payer: Self-pay | Admitting: Cardiology

## 2022-04-12 DIAGNOSIS — L570 Actinic keratosis: Secondary | ICD-10-CM | POA: Diagnosis not present

## 2022-04-12 DIAGNOSIS — L820 Inflamed seborrheic keratosis: Secondary | ICD-10-CM | POA: Diagnosis not present

## 2022-04-12 DIAGNOSIS — D485 Neoplasm of uncertain behavior of skin: Secondary | ICD-10-CM | POA: Diagnosis not present

## 2022-04-12 DIAGNOSIS — E663 Overweight: Secondary | ICD-10-CM | POA: Diagnosis not present

## 2022-04-12 NOTE — Telephone Encounter (Signed)
Rx refill sent to pharmacy. 

## 2022-04-16 NOTE — Progress Notes (Unsigned)
Assessment/Plan:   1.  Tremor predominant Parkinson's disease  -We discussed the diagnosis as well as pathophysiology of the disease.  We discussed treatment options as well as prognostic indicators.  Patient education was provided.  -Greater than 50% of the 60 minute visit was spent in counseling answering questions and talking about what to expect now as well as in the future.  We talked about medication options as well as potential future surgical options.  We talked about safety in the home.  -We decided to add carbidopa/levodopa 25/100.  1/2 tab tid x 1 wk, then 1/2 in am & noon & 1 at night for a week, then 1/2 in am &1 at noon &night for a week, then 1 po tid.  Risks, benefits, side effects and alternative therapies were discussed.  The opportunity to ask questions was given and they were answered to the best of my ability.  The patient expressed understanding and willingness to follow the outlined treatment protocols.  -We discussed community resources in the area including patient support groups and community exercise programs for PD and pt education was provided to the patient.  2.  Possible TIA, November, 2023  -Workup negative.  -Patient on aspirin and Crestor, 20 mg daily.  His LDL was only 35 and total cholesterol was actually quite low at 85.  -he still does have L facial droop and discussed whether we should do MRI with contrast for completeness sake.  He was agreeable and we will order that.  Subjective:   Wesley Jackson was seen today in the movement disorders clinic for neurologic consultation at the request of Angelina Sheriff, MD.  The consultation is for the evaluation of resting tremor of the left hand.  This patient is accompanied in the office by his spouse who supplements the history.  Pt reports L hand tremor started in about August (prior to recent hospitalization)  Patient was recently in the hospital at [facility] with TIA symptoms.  He had apparently woke up the  morning of presentation with left arm paresthesias.  Patient was taken to the emergency room where NIH stroke scale was apparently 1.  CT brain was apparently negative.  MRI brain was apparently also unremarkable.  Echocardiogram was and was reported to have a normal ejection fraction of 60 to 65%.  There was moderate mitral regurg.  Carotid ultrasound was reported to show no significant stenosis.  This was all from the discharge summary.  I do not have the actual reports on individual studies.  Patient was already on aspirin and Crestor, 20 mg daily.  Those were continued.  LDL was 35.  Total cholesterol was only 85.  Patient was already also on Norvasc, which was decreased because of lower extremity edema.  Tremor: Yes.     How long has it been going on? 11/2021  At rest or with activation?  Rest (he is L hand dominant for writing)  Fam hx of tremor?  No.  Located where?  L arm and maybe the L leg; never on the R  Affected by caffeine:  No. (Drinks 2 cups coffee/day)  Affected by alcohol:  doesn't drink enough to know  Affected by stress:  No.  Affected by fatigue:  No.  Spills soup if on spoon:  No.   Other Specific Symptoms:  Voice: gotten weaker Sleep: sleeps well except for nocturia  Vivid Dreams:  No.  Acting out dreams:  No. Wet Pillows: Yes.  , "a little" Postural  symptoms:  Yes.  , a little worse  Falls?  No. Bradykinesia symptoms: difficulty getting out of a chair (attributes to back trouble and had epidural in summer and also has "stiff knees"); wife states shuffles but pt doesn't think he does; little slower when ambulates; walks the golf course without trouble Loss of smell:  Yes.   Loss of taste:  No. Urinary Incontinence:  No. Difficulty Swallowing:  No., but is more aware Handwriting, micrographia: Yes.   Trouble with ADL's:  No. (Wife may help with socks)  Trouble buttoning clothing: No. Depression:  a little more "despondent" Memory changes:  some short term  loss Hallucinations:  No.  visual distortions: No. N/V:  No. Lightheaded:  No.  Syncope: No. Diplopia:  No. Dyskinesia:  No.  I personally reviewed the patient's November, 2023 MRI of the brain while the patient was in the room.  His MRI looked very good.  There was virtually no white matter disease and very little atrophy.  PREVIOUS MEDICATIONS: none to date  ALLERGIES:  No Known Allergies  CURRENT MEDICATIONS:  Current Outpatient Medications  Medication Instructions   acetaminophen (TYLENOL) 500 mg, Oral, Every 6 hours PRN   amLODipine (NORVASC) 5 mg, Daily   aspirin EC 81 mg, Oral, Daily, Swallow whole.   finasteride (PROSCAR) 5 mg, Oral, Daily   losartan (COZAAR) 25 mg, Oral, Daily   methylPREDNISolone (MEDROL DOSEPAK) 4 MG TBPK tablet Take as directed with food   Multiple Vitamins-Minerals (CENTRUM SILVER ADULT 50+ PO) 1 tablet, Oral, Every other day   olmesartan (BENICAR) 20 mg, Oral, Daily   omeprazole (PRILOSEC) 20 mg, Oral, Every morning   Probiotic Product (PROBIOTIC GUMMIES PO) 1 capsule, Oral, Every other day   rosuvastatin (CRESTOR) 20 mg, Oral, Daily   tadalafil (CIALIS) 5 mg, Oral, Daily PRN, ED   tamsulosin (FLOMAX) 0.4 mg, Oral, Daily after supper    Objective:   PHYSICAL EXAMINATION:    VITALS:   Vitals:   04/28/22 1023  BP: (!) 142/76  Pulse: 62  SpO2: 96%  Weight: 192 lb (87.1 kg)  Height: '5\' 9"'$  (1.753 m)    GEN:  The patient appears stated age and is in NAD. HEENT:  Normocephalic, atraumatic.  The mucous membranes are moist. The superficial temporal arteries are without ropiness or tenderness. CV:  RRR Lungs:  CTAB Neck/HEME:  There are no carotid bruits bilaterally.  Neurological examination:  Orientation: The patient is alert and oriented x3.  Cranial nerves: There is mild L facial droop.  Extraocular muscles are intact. The visual fields are full to confrontational testing. The speech is fluent and clear. Soft palate rises symmetrically  and there is no tongue deviation. Hearing is intact to conversational tone. Sensation: Sensation is intact to light touch throughout (facial, trunk, extremities). Vibration is intact at the bilateral big toe. There is no extinction with double simultaneous stimulation.  Motor: Strength is 5/5 in the bilateral upper and lower extremities.   Shoulder shrug is equal and symmetric.  There is no pronator drift. Deep tendon reflexes: Deep tendon reflexes are 2/4 at the bilateral biceps, triceps, brachioradialis, patella and achilles. Plantar responses are downgoing bilaterally.  Movement examination: Tone: There is mod increased tone in the LUE/LLE and mild to mod increased tone in the RUE/RLE Abnormal movements: there is intermittent LUE/LLE rest tremor that increases with distraction and more rare RLE rest tremor Coordination:  There is  decremation with RAM's, only with hand opening and closing on the L.  All other RAMs are normal bilaterally Gait and Station: The patient has no difficulty arising out of a deep-seated chair without the use of the hands. The patient's stride length is good.   I have reviewed and interpreted the following labs independently   Chemistry      Component Value Date/Time   NA 142 09/29/2021 0958   K 4.2 09/29/2021 0958   CL 104 09/29/2021 0958   CO2 26 09/29/2021 0958   BUN 8 09/29/2021 0958   CREATININE 0.83 09/29/2021 0958      Component Value Date/Time   CALCIUM 9.2 09/29/2021 0958   ALKPHOS 89 12/17/2019 1245   AST 20 01/05/2022 0000   ALT 25 01/05/2022 0000   BILITOT 0.6 12/17/2019 1245      No results found for: "TSH" Lab Results  Component Value Date   WBC 5.0 12/17/2019   HGB 14.3 12/17/2019   HCT 43.3 12/17/2019   MCV 88.0 12/17/2019   PLT 208 12/17/2019      Total time spent on today's visit was 60 minutes, including both face-to-face time and nonface-to-face time.  Time included that spent on review of records (prior notes available to  me/labs/imaging if pertinent), discussing treatment and goals, answering patient's questions and coordinating care.  Cc:  Angelina Sheriff, MD

## 2022-04-27 DIAGNOSIS — D510 Vitamin B12 deficiency anemia due to intrinsic factor deficiency: Secondary | ICD-10-CM | POA: Diagnosis not present

## 2022-04-28 ENCOUNTER — Ambulatory Visit (INDEPENDENT_AMBULATORY_CARE_PROVIDER_SITE_OTHER): Payer: PPO | Admitting: Neurology

## 2022-04-28 ENCOUNTER — Encounter: Payer: Self-pay | Admitting: Neurology

## 2022-04-28 VITALS — BP 142/76 | HR 62 | Ht 69.0 in | Wt 192.0 lb

## 2022-04-28 DIAGNOSIS — G20A1 Parkinson's disease without dyskinesia, without mention of fluctuations: Secondary | ICD-10-CM

## 2022-04-28 DIAGNOSIS — R29810 Facial weakness: Secondary | ICD-10-CM

## 2022-04-28 MED ORDER — CARBIDOPA-LEVODOPA 25-100 MG PO TABS: 1.0000 | ORAL_TABLET | Freq: Three times a day (TID) | ORAL | 1 refills | Status: DC

## 2022-04-28 NOTE — Patient Instructions (Signed)
Start Carbidopa Levodopa as follows: Take 1/2 tablet three times daily, at least 30 minutes before meals (approximately 7am/11am/4pm), for one week Then take 1/2 tablet in the morning, 1/2 tablet in the afternoon, 1 tablet in the evening, at least 30 minutes before meals, for one week Then take 1/2 tablet in the morning, 1 tablet in the afternoon, 1 tablet in the evening, at least 30 minutes before meals, for one week Then take 1 tablet three times daily at 7am/11am/4pm, at least 30 minutes before meals   As a reminder, carbidopa/levodopa can be taken at the same time as a carbohydrate, but we like to have you take your pill either 30 minutes before a protein source or 1 hour after as protein can interfere with carbidopa/levodopa absorption.  Local and Online Resources for Power over Parkinson's Group  December 2023    LOCAL [place] PARKINSON'S GROUPS   Power over Parkinson's Group:    Power Over Parkinson's Patient Education Group will be Wednesday, December 13th-*Hybrid meting*- in person at [facility] location and via [facility], 2:00-3:00 pm.   Starting in November, Power over [employer] and Care Partner Groups will meet together, with plans for separate break out session for caregivers (*this will be evolving over the next few months) Upcoming Power over Parkinson's Meetings/Care Partner Support:  2nd Wednesdays of the month at 2 pm:   December 13th, January 10th  [place] at [email or web address] if interested in participating in this group    [place] Party!  Wednesday, December 6th, 4:00-5:00 pm.  [facility] and Fitness.  RSVP to Garnetta Buddy at [phone] or [email or web address] New PWR! Moves Dynegy Instructor-Led Classes offering at [employer]!  TUESDAYS and Wednesdays 1-2 pm.   Contact Vonna Kotyk at  [email or web address]  or [phone] (Tuesday classes are modified for  chair and standing only) Dance for Parkinson 's classes will be on Tuesdays 9:30am-10:30am starting October 3-December 12 with a break the week of November 21st. Located in the [employer] , in the first floor of the [employer] ([place] To register:  [email or web address] or [phone]  Drumming for Parkinson's will be held on 2nd and 4th Mondays at 11:00 am.   Located at the [place] ([place]  [place] at [email or web address] or [phone]  Through support from the [place] for Parkinson's classes are free for both patients and caregivers.    [employer] Parkinson's Tai Chi Class, Mondays at 11 am.  Call [phone] for details   [place]:  [email or web address]  PD Health at Home continues:  Mindfulness Mondays, Wellness Wednesdays, Fitness Fridays   Upcoming Education:    Eating and Feeling Well through the [place]. Wednesday, Dec. 6th,  1-2 pm  Hospital Safety.  Wednesday, Dec. 13th, 1-2 pm Register for expert briefings (webinars) at [email or web address]  Please check out their website to sign up for emails and see their full online offerings      [place]:  [email or web address]   Third Thursday Webinars:  On the third Thursday of every month at 12 p.m. ET, join our free live webinars to learn about various aspects of living with Parkinson's disease and our work to speed medical breakthroughs.  Upcoming Webinar:  Tools for Diagnosing and Visualizing Parkinson's Disease.  Thursday, December 21st at 12 noon. Check out additional information on their website  to see their full online offerings    [employer]:  [email or web address]  Upcoming Webinar:   Stay tuned  Webinar Series:  Living with  Parkinson's Meetup.   Third Thursdays each month, 3 pm  Care Partner Monthly Meetup.  With Robin Searing Phinney.  First Tuesday of each month, 2 pm  Check out additional information to Live Well Today on their website    Parkinson and Movement Disorders (PMD) Alliance:  www.[email or web address]  [employer] Online:  Online Education Events  Sign up for emails, which are sent weekly to give you updates on programming and online offerings    Parkinson's Association of the [place]:  www.[email or web address]  Information on online support groups, education events, and online exercises including Yoga, Parkinson's exercises and more-LOTS of information on links to PD resources and online events  Virtual Support Group through Parkinson's Association of the [place]; next one is scheduled for Wednesday, December 6th  at 2 pm.  (These are typically scheduled for the 1st Wednesday of the month at 2 pm).  Visit website for details.   MOVEMENT AND EXERCISE OPPORTUNITIES  PWR! Moves Classes at [place].  Wednesdays 10 and 11 am.   Contact Amy Marriott, PT [email or web address] if interested.  NEW PWR! Moves Class offerings at [employer].  *TUESDAYS* and Wednesdays 1-2 pm.    Contact Vonna Kotyk at  [email or web address]    Parkinson's Wellness Recovery (PWR! Moves)  www.[email or web address]  Info on the PWR! Virtual Experience:  You will have access to our expertise?through self-assessment, guided plans that start with the PD-specific fundamentals, educational content, tips, Q&A with an expert, and a growing Art therapist of PD-specific pre-recorded and live exercise classes of varying types and intensity - both physical and cognitive! If that is not enough, we offer 1:1 wellness consultations (in-person or virtual) to personalize your PWR! Research scientist (medical).   [place] Fridays:   As part of the PD Health @ Home program, this free video series focuses each  week on one aspect of fitness designed to support people living with Parkinson's.? These weekly videos highlight the [place] fitness guidelines for people with Parkinson's disease.  [email or web address]   Dance for PD website is offering free, live-stream classes throughout the week, as well as links to [employer] of classes:  [email or web address]  Virtual dance and Pilates for Parkinson's classes: Click on the Community Tab> Parkinson's Movement Initiative Tab.  To register for classes and for more information, visit [email or web address] and click the "community" tab.   [employer] Parkinson's Cycling Classes   [facility]:  Thursdays @ Noon-Live classes at [employer] ([employer] at [email or web address]?or [phone])  [facility]: Virtual Classes Mondays and Thursdays Jeanette Caprice classes Tuesday, Wednesday and Thursday (contact [place] at [email or web address] ?or [phone])  [employer]  Varied levels of classes are offered Tuesdays and Thursdays at [employer].   Stretching with Verdis Frederickson weekly class is also offered for people with Parkinson's  To observe a class or for more information, call [phone] or email Hezzie Bump at [email or web address]   ADDITIONAL SUPPORT AND RESOURCES  [employer]:Online Caregiver Education Opportunities:  www.well-springsolutions.org/caregiver-education/caregiver-support-group.  You may also contact Vickki Muff at [email or web address] or [phone].     Well-Spring Navigator:  Just1Navigator program, a?free service to help individuals and families through the journey of determining care for older adults.  The "Navigator" is a Education officer, museum, Arnell Asal, who will speak with a prospective client and/or loved ones to provide an assessment of the situation and  a set of recommendations for a personalized  care plan -- all free of charge, and whether?Well[employer]Spring [employer] offers the needed service or not. If the need is not a service we provide, we are well-connected with reputable programs in town that we can refer you to.  www.well-springsolutions.org or to speak with the Navigator, call [phone].

## 2022-05-23 ENCOUNTER — Ambulatory Visit
Admission: RE | Admit: 2022-05-23 | Discharge: 2022-05-23 | Disposition: A | Discharge status: Home / Self Care | Payer: PPO | Source: Ambulatory Visit | Attending: Neurology | Admitting: Neurology

## 2022-05-23 DIAGNOSIS — R29810 Facial weakness: Secondary | ICD-10-CM | POA: Diagnosis not present

## 2022-05-23 MED ORDER — GADOPICLENOL 0.5 MMOL/ML IV SOLN
8.5000 mL | Freq: Once | INTRAVENOUS | Status: AC | PRN
  Administered 2022-05-23: 8.5 mL via INTRAVENOUS

## 2022-05-26 ENCOUNTER — Telehealth: Payer: Self-pay | Admitting: Neurology

## 2022-05-26 DIAGNOSIS — D510 Vitamin B12 deficiency anemia due to intrinsic factor deficiency: Secondary | ICD-10-CM | POA: Diagnosis not present

## 2022-05-26 NOTE — Telephone Encounter (Signed)
Called patient and delivered results. Patient understood and had no other questions at this time

## 2022-05-26 NOTE — Telephone Encounter (Signed)
Pt called back in returning a call about results

## 2022-06-17 DIAGNOSIS — R351 Nocturia: Secondary | ICD-10-CM | POA: Diagnosis not present

## 2022-06-17 DIAGNOSIS — C61 Malignant neoplasm of prostate: Secondary | ICD-10-CM | POA: Diagnosis not present

## 2022-07-13 DIAGNOSIS — H02115 Cicatricial ectropion of left lower eyelid: Secondary | ICD-10-CM | POA: Diagnosis not present

## 2022-07-13 DIAGNOSIS — H02112 Cicatricial ectropion of right lower eyelid: Secondary | ICD-10-CM | POA: Diagnosis not present

## 2022-08-26 DIAGNOSIS — J329 Chronic sinusitis, unspecified: Secondary | ICD-10-CM | POA: Diagnosis not present

## 2022-08-26 DIAGNOSIS — J40 Bronchitis, not specified as acute or chronic: Secondary | ICD-10-CM | POA: Diagnosis not present

## 2022-08-26 DIAGNOSIS — D510 Vitamin B12 deficiency anemia due to intrinsic factor deficiency: Secondary | ICD-10-CM | POA: Diagnosis not present

## 2022-09-07 NOTE — Progress Notes (Unsigned)
Assessment/Plan:   1.  Parkinsons Disease, diagnosed January, 2024  -continue carbidopa/levodopa at 7am/11am/4pm.  He is markedly improved on this  -We discussed that it used to be thought that levodopa would increase risk of melanoma but now it is believed that Parkinsons itself likely increases risk of melanoma. he is to get regular skin checks.  He is seeing dermatology in [place]  -proud of him for all the exercise  2.  Possible TIA, November 2023.  -Workup negative, including MRI brain with and without gadolinium in January, 2024 (done because of continued left facial droop).             -Patient on aspirin and Crestor, 20 mg daily.  His LDL was only 35 and total cholesterol was actually quite low at 85.  3.  Lightheadness  -on multiple BP medications.  Told him to f/u with PCP.  Told him that tamsulosin may contribute as well.    -keep blood pressure log and take to pcp Subjective:   Wesley [facility] was seen today in follow up for Parkinsons disease, diagnosed last visit.  My previous records were reviewed prior to todays visit as well as outside records available to me.  This patient is accompanied in the office by his spouse who supplements the history. Patient was started on levodopa.  Patient is tolerating the medication well, without side effects.   He is no longer having stiffness and gets OOB easier.  He also gets up/down stairs easier.  Pt denies falls.  Pt with some lightheadedness with working out with rsb.    No hallucinations.  Mood has been good.  Last visit, we also discussed that patient may have had a TIA in November, 2023.  Workup was negative.  Patient on aspirin and Crestor.  Because patient still had left facial droop, we decided to go ahead and proceed with MRI brain.  This was done March 25, 2023 with and without contrast.  It was unremarkable.  He is in [facility].    Current prescribed movement disorder medications: Carbidopa/levodopa 25/100, 1 tablet 3 times per day  (started last visit)   PREVIOUS MEDICATIONS: Sinemet  ALLERGIES:  No Known Allergies  CURRENT MEDICATIONS:  Current Meds  Medication Sig   acetaminophen (TYLENOL) 500 MG tablet Take 500 mg by mouth every 6 (six) hours as needed.   aspirin EC 81 MG tablet Take 1 tablet (81 mg total) by mouth daily. Swallow whole.   carbidopa-levodopa (SINEMET IR) 25-100 MG tablet Take 1 tablet by mouth 3 (three) times daily. 7am/11am/4pm   finasteride (PROSCAR) 5 MG tablet Take 5 mg by mouth daily.   losartan (COZAAR) 25 MG tablet Take 25 mg by mouth daily.   olmesartan (BENICAR) 20 MG tablet Take 20 mg by mouth daily.   omeprazole (PRILOSEC) 20 MG capsule Take 20 mg by mouth every morning.   rosuvastatin (CRESTOR) 20 MG tablet TAKE 1 TABLET BY MOUTH DAILY.   tadalafil (CIALIS) 5 MG tablet Take 5 mg by mouth daily as needed for erectile dysfunction. ED   tamsulosin (FLOMAX) 0.4 MG CAPS capsule Take 0.4 mg by mouth 3 (three) times daily.     Objective:   PHYSICAL EXAMINATION:    VITALS:   Vitals:   09/09/22 0859  BP: 136/60  Pulse: 66  SpO2: 98%  Weight: 182 lb 6.4 oz (82.7 kg)  Height: 5\' 9"  (1.753 m)   Wt Readings from Last 3 Encounters:  09/09/22 182 lb 6.4 oz (82.7  kg)  04/28/22 192 lb (87.1 kg)  11/24/21 192 lb 3.2 oz (87.2 kg)     GEN:  The patient appears stated age and is in NAD. HEENT:  Normocephalic, atraumatic.  The mucous membranes are moist. The superficial temporal arteries are without ropiness or tenderness. CV:  RRR Lungs:  CTAB Neck/HEME:  There are no carotid bruits bilaterally.  Neurological examination:   Orientation: The patient is alert and oriented x3.  Cranial nerves: There is mild L facial droop.  Extraocular muscles are intact. The visual fields are full to confrontational testing. The speech is fluent and clear. Soft palate rises symmetrically and there is no tongue deviation. Hearing is intact to conversational tone. Sensation: Sensation is intact to  light touch throughout (facial, trunk, extremities). Vibration is intact at the bilateral big toe. There is no extinction with double simultaneous stimulation.  Motor: Strength is at least antigravity x 4   Movement examination: Tone: There is nl tone in the UE/LE Abnormal movements: there is intermittent LUE/LLE rest tremor that increases with distraction and more rare RLE rest tremor Coordination:  There is no decremation with any form of RAMS, including alternating supination and pronation of the forearm, hand opening and closing, finger taps, heel taps and toe taps bilaterally Gait and Station: The patient has no difficulty arising out of a deep-seated chair without the use of the hands. The patient's stride length is good.    I have reviewed and interpreted the following labs independently    Chemistry      Component Value Date/Time   NA 142 09/29/2021 0958   K 4.2 09/29/2021 0958   CL 104 09/29/2021 0958   CO2 26 09/29/2021 0958   BUN 8 09/29/2021 0958   CREATININE 0.83 09/29/2021 0958      Component Value Date/Time   CALCIUM 9.2 09/29/2021 0958   ALKPHOS 89 12/17/2019 1245   AST 20 01/05/2022 0000   ALT 25 01/05/2022 0000   BILITOT 0.6 12/17/2019 1245       Lab Results  Component Value Date   WBC 5.0 12/17/2019   HGB 14.3 12/17/2019   HCT 43.3 12/17/2019   MCV 88.0 12/17/2019   PLT 208 12/17/2019    No results found for: "TSH"   Total time spent on today's visit was 30 minutes, including both face-to-face time and nonface-to-face time.  Time included that spent on review of records (prior notes available to me/labs/imaging if pertinent), discussing treatment and goals, answering patient's questions and coordinating care.  Cc:  Noni Saupe, MD

## 2022-09-09 ENCOUNTER — Encounter: Payer: Self-pay | Admitting: Neurology

## 2022-09-09 ENCOUNTER — Ambulatory Visit: Payer: PPO | Admitting: Neurology

## 2022-09-09 VITALS — BP 136/60 | HR 66 | Ht 69.0 in | Wt 182.4 lb

## 2022-09-09 DIAGNOSIS — R42 Dizziness and giddiness: Secondary | ICD-10-CM

## 2022-09-09 DIAGNOSIS — G20A1 Parkinson's disease without dyskinesia, without mention of fluctuations: Secondary | ICD-10-CM | POA: Diagnosis not present

## 2022-09-09 NOTE — Patient Instructions (Signed)
Keep a blood pressure log for your primary care.  The physicians and staff at [facility] are committed to providing excellent care. You may receive a survey requesting feedback about your experience at our office. We strive to receive "very good" responses to the survey questions. If you feel that your experience would prevent you from giving the office a "very good " response, please contact our office to try to remedy the situation. We may be reached at [phone]. Thank you for taking the time out of your busy day to complete the survey.

## 2022-09-13 ENCOUNTER — Telehealth: Payer: Self-pay | Admitting: Cardiology

## 2022-09-13 NOTE — Telephone Encounter (Signed)
Pt had diarrhea on Saturday. Pt states that he has not had any nausea or vomiting. Pt reports that the heart rate and lightlessness increases with standing. Advised to increase fluids (water, Gatorade zero or Pedialyte) and add extra salt in his diet this evening. Pt also advised to to check his BP in the morning before taking his medication and if it remains low hold his medication and call the office. Please advise.

## 2022-09-13 NOTE — Telephone Encounter (Signed)
STAT if HR is under 50 or over 120 (normal HR is 60-100 beats per minute)  What is your heart rate? 93 - resting   Do you have a log of your heart rate readings (document readings)?   Do you have any other symptoms? Fluctuating BP (93/54 BP) and light headed.

## 2022-10-04 ENCOUNTER — Ambulatory Visit: Payer: PPO | Attending: Cardiology | Admitting: Cardiology

## 2022-10-04 ENCOUNTER — Encounter: Payer: Self-pay | Admitting: Cardiology

## 2022-10-04 ENCOUNTER — Ambulatory Visit: Payer: PPO | Attending: Cardiology

## 2022-10-04 VITALS — BP 128/60 | HR 73 | Ht 68.0 in | Wt 179.4 lb

## 2022-10-04 DIAGNOSIS — E785 Hyperlipidemia, unspecified: Secondary | ICD-10-CM

## 2022-10-04 DIAGNOSIS — R002 Palpitations: Secondary | ICD-10-CM | POA: Diagnosis not present

## 2022-10-04 DIAGNOSIS — G20A1 Parkinson's disease without dyskinesia, without mention of fluctuations: Secondary | ICD-10-CM | POA: Diagnosis not present

## 2022-10-04 DIAGNOSIS — I493 Ventricular premature depolarization: Secondary | ICD-10-CM | POA: Diagnosis not present

## 2022-10-04 DIAGNOSIS — I2510 Atherosclerotic heart disease of native coronary artery without angina pectoris: Secondary | ICD-10-CM | POA: Diagnosis not present

## 2022-10-04 DIAGNOSIS — R0609 Other forms of dyspnea: Secondary | ICD-10-CM | POA: Diagnosis not present

## 2022-10-04 NOTE — Progress Notes (Unsigned)
Cardiology Office Note:    Date:  10/04/2022   ID:  Wesley [facility], DOB May 08, 1940, MRN [id number]  PCP:  Noni Saupe, MD  Cardiologist:  Gypsy Balsam, MD    Referring MD: Noni Saupe, MD   Chief Complaint  Patient presents with   Tachycardia   Dizziness    History of Present Illness:    Wesley Jackson is a 82 y.o. male past medical history significant for coronary artery disease, he did have coronary CT angio which showed moderate disease of mid LAD however FFR was negative, recently diagnosed with Parkinson disease, dyslipidemia, heart murmur.  Comes today to my office for follow-up.  Overall doing well.  Since he was diagnosed with Parkinson which happened in January he started participating in some exercise program and he feels dramatically better.  He is also on carbidopa which seems to be helping.  Denies have any chest pain tightness squeezing pressure mid chest.  Few weeks ago he call us because of profound weakness fatigue tiredness and palpitations.  However that was after bouts of diarrhea we advised to improve hydration and since that time he is doing better but still he describes few episodes that he is to with flip-flop.  Past Medical History:  Diagnosis Date   Arthritis    herniated disc lower back took injection 2017, occ lower back spams had epidural 2020   Atypical chest pain 09/22/2021   BPH (benign prostatic hyperplasia)    Cellulitis 10/30/2021   Dyslipidemia 09/22/2021   Dyspnea on exertion 09/22/2021   Eye problem 11/20/2021   GERD (gastroesophageal reflux disease)    Hypertension    Nasal congestion with rhinorrhea 01/21/2015   Open wound of lower extremity 10/30/2021   Prostate cancer (HCC) 2021   TIA (transient ischemic attack)     Past Surgical History:  Procedure Laterality Date   ACHILLES TENDON REPAIR Left yrs ago   ARTHROSCOPY KNEE W/ DRILLING Right yrs ago   CYSTOSCOPY N/A 12/20/2019   Procedure: CYSTOSCOPY FLEXIBLE;   Surgeon: Debroah Baller, MD;  [facility];  Service: Urology;  Laterality: N/A;   EYE SURGERY Bilateral 2019   cataracts both eyes   HERNIA REPAIR Bilateral yrs ago   inguinal hernia    NASAL TURBINATE REDUCTION  05/2019   PROSTATE BIOPSY  2021   RADIOACTIVE SEED IMPLANT N/A 12/20/2019   Procedure: RADIOACTIVE SEED IMPLANT/BRACHYTHERAPY IMPLANT;  Surgeon: Debroah Baller, MD;  [facility];  Service: Urology;  Laterality: N/A;   skin biopsies, nose  08/2019   SPACE OAR INSTILLATION N/A 12/20/2019   Procedure: SPACE OAR INSTILLATION;  Surgeon: Debroah Baller, MD;  [facility];  Service: Urology;  Laterality: N/A;    Current Medications: Current Meds  Medication Sig   acetaminophen (TYLENOL) 500 MG tablet Take 500 mg by mouth every 6 (six) hours as needed for mild pain or moderate pain.   aspirin EC 81 MG tablet Take 1 tablet (81 mg total) by mouth daily. Swallow whole.   carbidopa-levodopa (SINEMET IR) 25-100 MG tablet Take 1 tablet by mouth 3 (three) times daily. 7am/11am/4pm   finasteride (PROSCAR) 5 MG tablet Take 5 mg by mouth daily.   losartan (COZAAR) 25 MG tablet Take 25 mg by mouth daily.   Multiple Vitamins-Minerals (CENTRUM SILVER ADULT 50+ PO) Take 1 tablet by mouth every other day.   olmesartan (BENICAR) 20 MG tablet Take 20 mg by mouth daily.   omeprazole (  PRILOSEC) 20 MG capsule Take 20 mg by mouth every morning.   Probiotic Product (PROBIOTIC GUMMIES PO) Take 1 capsule by mouth every other day.   rosuvastatin (CRESTOR) 20 MG tablet TAKE 1 TABLET BY MOUTH DAILY.   tadalafil (CIALIS) 5 MG tablet Take 5 mg by mouth daily as needed for erectile dysfunction. ED   tamsulosin (FLOMAX) 0.4 MG CAPS capsule Take 0.4 mg by mouth 3 (three) times daily.   [DISCONTINUED] methylPREDNISolone (MEDROL DOSEPAK) 4 MG TBPK tablet Take as directed with food (Patient taking differently: Take 4 mg by mouth as directed. Take as  directed with food)     Allergies:   Patient has no known allergies.   Social History   Socioeconomic History   Marital status: Married    Spouse name: Not on file   Number of children: Not on file   Years of education: Not on file   Highest education level: Not on file  Occupational History   Occupation: retired    Comment: Stage manager  Tobacco Use   Smoking status: Never   Smokeless tobacco: Never  Vaping Use   Vaping Use: Never used  Substance and Sexual Activity   Alcohol use: Yes    Alcohol/week: 1.0 standard drink of alcohol    Types: 1 Cans of beer per week    Comment: 1 beer every other month   Drug use: No   Sexual activity: Not Currently  Other Topics Concern   Not on file  Social History Narrative   Left and right handed    Retired    International aid/development worker of Corporate investment banker Strain: Not on file  Food Insecurity: Not on file  Transportation Needs: Not on file  Physical Activity: Not on file  Stress: Not on file  Social Connections: Not on file     Family History: The patient's family history includes COPD in his mother; Diabetes in his father; Heart attack in his father. There is no history of Prostate cancer, Colon cancer, Pancreatic cancer, or Breast cancer. ROS:   Please see the history of present illness.    All 14 point review of systems negative except as described per history of present illness  EKGs/Labs/Other Studies Reviewed:      Recent Labs: 01/05/2022: ALT 25  Recent Lipid Panel    Component Value Date/Time   CHOL 97 (L) 01/05/2022 0000   TRIG 58 01/05/2022 0000   HDL 45 01/05/2022 0000   CHOLHDL 2.2 01/05/2022 0000   LDLCALC 39 01/05/2022 0000   LDLDIRECT 100 (H) 11/24/2021 1501    Physical Exam:    VS:  BP 128/60 (BP Location: Left Arm, Patient Position: Sitting)   Pulse 73   Ht 5\' 8"  (1.727 m)   Wt 179 lb 6.4 oz (81.4 kg)   SpO2 95%   BMI 27.28 kg/m     Wt Readings from Last 3 Encounters:  10/04/22 179  lb 6.4 oz (81.4 kg)  09/09/22 182 lb 6.4 oz (82.7 kg)  04/28/22 192 lb (87.1 kg)     GEN:  Well nourished, well developed in no acute distress HEENT: Normal NECK: No JVD; No carotid bruits LYMPHATICS: No lymphadenopathy CARDIAC: RRR, systolic ejection murmur grade 2/6 best heard right upper portion of the sternum, no rubs, no gallops RESPIRATORY:  Clear to auscultation without rales, wheezing or rhonchi  ABDOMEN: Soft, non-tender, non-distended MUSCULOSKELETAL:  No edema; No deformity  SKIN: Warm and dry LOWER EXTREMITIES: no swelling NEUROLOGIC:  Alert and  oriented x 3 PSYCHIATRIC:  Normal affect   ASSESSMENT:    1. Parkinson's disease, unspecified whether dyskinesia present, unspecified whether manifestations fluctuate    PLAN:    In order of problems listed above:  Parkinson: Followed by neurology he is doing much better now with exercises and appropriate medical therapy. Coronary artery disease asymptomatic and does not have any symptoms that would suggest reactivation of the problem, will continue antiplatelet therapy. Palpitations.  I will schedule have Zio patch. Heart murmur: He will have echocardiogram done. Dyslipidemia I did review K PN which show me data from September of last year with LDL of 39 HDL 45 will recheck fasting lipid profile   Medication Adjustments/Labs and Tests Ordered: Current medicines are reviewed at length with the patient today.  Concerns regarding medicines are outlined above.  No orders of the defined types were placed in this encounter.  Medication changes: No orders of the defined types were placed in this encounter.   Signed, Wesley Lea, MD, [facility] 10/04/2022 2:53 PM    [facility]

## 2022-10-04 NOTE — Patient Instructions (Signed)
Medication Instructions:  Your physician recommends that you continue on your current medications as directed. Please refer to the Current Medication list given to you today.  *If you need a refill on your cardiac medications before your next appointment, please call your pharmacy*   Lab Work: Your physician recommends that you return for lab work in: when fasting You need to have labs done when you are fasting.  You can come Monday through Friday 8:30 am to 12:00 pm and 1:15 to 4:30. You do not need to make an appointment as the order has already been placed. The labs you are going to have done are Lipid, AST, ALT.    Testing/Procedures: Your physician has requested that you have an echocardiogram. Echocardiography is a painless test that uses sound waves to create images of your heart. It provides your doctor with information about the size and shape of your heart and how well your heart's chambers and valves are working. This procedure takes approximately one hour. There are no restrictions for this procedure. Please do NOT wear cologne, perfume, aftershave, or lotions (deodorant is allowed). Please arrive 15 minutes prior to your appointment time.   WHY IS MY DOCTOR PRESCRIBING ZIO? The Zio system is proven and trusted by physicians to detect and diagnose irregular heart rhythms -- and has been prescribed to hundreds of thousands of patients.  The FDA has cleared the Zio system to monitor for many different kinds of irregular heart rhythms. In a study, physicians were able to reach a diagnosis 90% of the time with the Zio system1.  You can wear the Zio monitor -- a small, discreet, comfortable patch -- during your normal day-to-day activity, including while you sleep, shower, and exercise, while it records every single heartbeat for analysis.  1Barrett, P., et al. Comparison of 24 Hour Holter Monitoring Versus 14 Day Novel Adhesive Patch Electrocardiographic Monitoring. American Journal of  Medicine, 2014.  ZIO VS. HOLTER MONITORING The Zio monitor can be comfortably worn for up to 14 days. Holter monitors can be worn for 24 to 48 hours, limiting the time to record any irregular heart rhythms you may have. Zio is able to capture data for the 51% of patients who have their first symptom-triggered arrhythmia after 48 hours.1  LIVE WITHOUT RESTRICTIONS The Zio ambulatory cardiac monitor is a small, unobtrusive, and water-resistant patch--you might even forget you're wearing it. The Zio monitor records and stores every beat of your heart, whether you're sleeping, working out, or showering.      Follow-Up: At [facility], you and your health needs are our priority.  As part of our continuing mission to provide you with exceptional heart care, we have created designated Provider Care Teams.  These Care Teams include your primary Cardiologist (physician) and Advanced Practice Providers (APPs -  Physician Assistants and Nurse Practitioners) who all work together to provide you with the care you need, when you need it.  We recommend signing up for the patient portal called "MyChart".  Sign up information is provided on this After Visit Summary.  MyChart is used to connect with patients for Virtual Visits (Telemedicine).  Patients are able to view lab/test results, encounter notes, upcoming appointments, etc.  Non-urgent messages can be sent to your provider as well.   To learn more about what you can do with MyChart, go to [email or web address].    Your next appointment:   6 month(s)  The format for your next appointment:   In Person  Provider:  Jenne Campus, MD    Other Instructions NA

## 2022-10-07 ENCOUNTER — Telehealth: Payer: Self-pay | Admitting: *Deleted

## 2022-10-07 DIAGNOSIS — E785 Hyperlipidemia, unspecified: Secondary | ICD-10-CM | POA: Diagnosis not present

## 2022-10-07 NOTE — Telephone Encounter (Signed)
Pt came in for blood work today and had his zio heart monitor with him. It had fallen off after one day due to excessive sweating while golfing. Called the company for pt and spoke with [place]. She arranged to have another monitor sent out to the pt and the 1st monitor was put in the mail here in the office. Instructed pt to avoid excessive sweating and let us know if he needs any help applying the new monitor when it arrives. Pt verbalized understanding and had no further questions.

## 2022-10-08 LAB — LIPID PANEL
Chol/HDL Ratio: 2.2 ratio (ref 0.0–5.0)
Cholesterol, Total: 128 mg/dL (ref 100–199)
HDL: 59 mg/dL (ref >39)
LDL Chol Calc (NIH): 57 mg/dL (ref 0–99)
Triglycerides: 52 mg/dL (ref 0–149)
VLDL Cholesterol Cal: 12 mg/dL (ref 5–40)

## 2022-10-08 LAB — ALT: ALT: 13 IU/L (ref 0–44)

## 2022-10-08 LAB — AST: AST: 23 IU/L (ref 0–40)

## 2022-10-11 ENCOUNTER — Other Ambulatory Visit: Payer: Self-pay | Admitting: Neurology

## 2022-10-15 ENCOUNTER — Telehealth: Payer: Self-pay

## 2022-10-15 NOTE — Telephone Encounter (Addendum)
Patient notified of results.

## 2022-10-15 NOTE — Telephone Encounter (Signed)
-----   Message from Georgeanna Lea, MD sent at 10/14/2022 11:34 AM EDT ----- Cholesterol looks, continue present management

## 2022-10-19 DIAGNOSIS — I10 Essential (primary) hypertension: Secondary | ICD-10-CM | POA: Diagnosis not present

## 2022-10-19 DIAGNOSIS — Z0000 Encounter for general adult medical examination without abnormal findings: Secondary | ICD-10-CM | POA: Diagnosis not present

## 2022-10-19 DIAGNOSIS — L309 Dermatitis, unspecified: Secondary | ICD-10-CM | POA: Diagnosis not present

## 2022-10-19 DIAGNOSIS — Z79899 Other long term (current) drug therapy: Secondary | ICD-10-CM | POA: Diagnosis not present

## 2022-10-19 DIAGNOSIS — Z1331 Encounter for screening for depression: Secondary | ICD-10-CM | POA: Diagnosis not present

## 2022-10-19 DIAGNOSIS — Z6826 Body mass index (BMI) 26.0-26.9, adult: Secondary | ICD-10-CM | POA: Diagnosis not present

## 2022-10-19 DIAGNOSIS — Z1339 Encounter for screening examination for other mental health and behavioral disorders: Secondary | ICD-10-CM | POA: Diagnosis not present

## 2022-10-20 DIAGNOSIS — L720 Epidermal cyst: Secondary | ICD-10-CM | POA: Diagnosis not present

## 2022-10-20 DIAGNOSIS — D692 Other nonthrombocytopenic purpura: Secondary | ICD-10-CM | POA: Diagnosis not present

## 2022-10-20 DIAGNOSIS — Z85828 Personal history of other malignant neoplasm of skin: Secondary | ICD-10-CM | POA: Diagnosis not present

## 2022-10-20 DIAGNOSIS — L570 Actinic keratosis: Secondary | ICD-10-CM | POA: Diagnosis not present

## 2022-10-20 DIAGNOSIS — D1801 Hemangioma of skin and subcutaneous tissue: Secondary | ICD-10-CM | POA: Diagnosis not present

## 2022-10-20 DIAGNOSIS — L821 Other seborrheic keratosis: Secondary | ICD-10-CM | POA: Diagnosis not present

## 2022-10-20 DIAGNOSIS — L578 Other skin changes due to chronic exposure to nonionizing radiation: Secondary | ICD-10-CM | POA: Diagnosis not present

## 2022-10-21 DIAGNOSIS — E538 Deficiency of other specified B group vitamins: Secondary | ICD-10-CM | POA: Diagnosis not present

## 2022-11-02 ENCOUNTER — Ambulatory Visit: Payer: PPO | Attending: Cardiology

## 2022-11-02 DIAGNOSIS — R0609 Other forms of dyspnea: Secondary | ICD-10-CM | POA: Diagnosis not present

## 2022-11-02 LAB — ECHOCARDIOGRAM COMPLETE
MV M vel: 5.52 m/s
MV Peak grad: 121.9 mmHg
Radius: 0.6 cm
S' Lateral: 2.9 cm
Single Plane A4C EF: 52.4 %

## 2022-11-05 ENCOUNTER — Telehealth: Payer: Self-pay

## 2022-11-05 NOTE — Telephone Encounter (Signed)
LVM per DPR- per Dr. Krasowski's note regarding normal Echo results. Encouraged to call with any questions. Routed to PCP. 

## 2022-11-08 ENCOUNTER — Telehealth: Payer: Self-pay | Admitting: *Deleted

## 2022-11-08 NOTE — Telephone Encounter (Signed)
Spoke with Mo extensively about an overdue monitor I keep getting emails on. It states the [id number] monitor is overdue. I believe the pt is still wearing it and it was a replacement for the one that fell off. Not sure when the pt got the new monitor or when he put it on but let Mo know that he would send it back soon and there is nothing further I am going to do about this monitor. She understood and we ended the call.

## 2022-11-18 DIAGNOSIS — R002 Palpitations: Secondary | ICD-10-CM | POA: Diagnosis not present

## 2022-12-09 ENCOUNTER — Telehealth: Payer: Self-pay

## 2022-12-09 NOTE — Telephone Encounter (Signed)
LM to return my call. 

## 2022-12-09 NOTE — Telephone Encounter (Signed)
-----   Message from Gypsy Balsam sent at 11/30/2022 12:44 PM EDT ----- Monitor shows some episode of ventricular tachycardia, we need to start him on beta-blockers I will start him on Toprol tartrate 25 twice daily

## 2022-12-10 ENCOUNTER — Telehealth: Payer: Self-pay | Admitting: Cardiology

## 2022-12-10 ENCOUNTER — Telehealth: Payer: Self-pay

## 2022-12-10 MED ORDER — METOPROLOL TARTRATE 25 MG PO TABS: 25.0000 mg | ORAL_TABLET | Freq: Two times a day (BID) | ORAL | 3 refills | Status: DC

## 2022-12-10 NOTE — Telephone Encounter (Signed)
Patient is returning phone call.  °

## 2022-12-10 NOTE — Telephone Encounter (Signed)
Spoke with pt regarding Monitor results. Pt agreed to start Metoprolol 25mg  Bid. He had no further questions. [facility] to [facility].

## 2022-12-20 DIAGNOSIS — L720 Epidermal cyst: Secondary | ICD-10-CM | POA: Diagnosis not present

## 2022-12-20 DIAGNOSIS — L089 Local infection of the skin and subcutaneous tissue, unspecified: Secondary | ICD-10-CM | POA: Diagnosis not present

## 2023-01-07 DIAGNOSIS — E538 Deficiency of other specified B group vitamins: Secondary | ICD-10-CM | POA: Diagnosis not present

## 2023-02-07 ENCOUNTER — Other Ambulatory Visit: Payer: Self-pay | Admitting: Neurology

## 2023-02-07 DIAGNOSIS — D519 Vitamin B12 deficiency anemia, unspecified: Secondary | ICD-10-CM | POA: Diagnosis not present

## 2023-02-24 ENCOUNTER — Other Ambulatory Visit: Payer: Self-pay | Admitting: Cardiology

## 2023-03-07 DIAGNOSIS — R351 Nocturia: Secondary | ICD-10-CM | POA: Diagnosis not present

## 2023-03-07 DIAGNOSIS — C61 Malignant neoplasm of prostate: Secondary | ICD-10-CM | POA: Diagnosis not present

## 2023-03-10 NOTE — Progress Notes (Signed)
Assessment/Plan:   1.  Parkinsons Disease, diagnosed January, 2024  -continue carbidopa/levodopa at 7am/11am/4pm.  He is markedly improved on this  -long discussion re: protein/diet and info printed on mediterranean diet.  Information given on high protein foods that are healthy and helpful, but to keep them away from the administration of levodopa  -We discussed that it used to be thought that levodopa would increase risk of melanoma but now it is believed that Parkinsons itself likely increases risk of melanoma. he is to get regular skin checks.  He is seeing dermatology in [place]  -proud of him for all the exercise  2.  Possible TIA, November 2023.  -Workup negative, including MRI brain with and without gadolinium in January, 2024 (done because of continued left facial droop).             -Patient on aspirin and Crestor, 20 mg daily.  His LDL was only 35 and total cholesterol was actually quite low at 85.  3. Hx of Lightheadness  -doing well right now  -on losartan, amlodipine, tamsulosin, metoprolol  -he is bradycardic now with the addition of metoprolol.  He is on it for runs of Vtach on zio patch.  Sees Dr. Josefina Do  Subjective:   Wesley Jackson was seen today in follow up for Parkinsons disease, diagnosed last visit.  My previous records were reviewed prior to todays visit as well as outside records available to me.  This patient is accompanied in the office by his spouse who supplements the history.  Patient is doing well on his Parkinson's medicine.  Last visit, patient was having episodes of lightheadedness and dizziness and we told him to follow-up with the physician who was prescribing his multiple blood pressure medications.  He did see his cardiologist in June and discussed these issues, but apparently told him that this was due to diarrhea and improved after that had resolved (not the history that we had previously gotten).  Medications were not changed.  He did have a Zio patch  placed and had some episodes of ventricular tachycardia and was started on a beta-blocker, 25 mg twice daily.  His HR is quite low today but he is not feeling lightheaded currently.  Exercising faithfully.    Current prescribed movement disorder medications: Carbidopa/levodopa 25/100, 1 tablet 3 times per day   PREVIOUS MEDICATIONS: Sinemet  ALLERGIES:  No Known Allergies  CURRENT MEDICATIONS:  Current Meds  Medication Sig   acetaminophen (TYLENOL) 500 MG tablet Take 500 mg by mouth every 6 (six) hours as needed for mild pain or moderate pain.   aspirin EC 81 MG tablet Take 1 tablet (81 mg total) by mouth daily. Swallow whole.   carbidopa-levodopa (SINEMET IR) 25-100 MG tablet TAKE 1 TABLET BY MOUTH THREE TIMES DAILY. (7AM/11AM/4PM)   finasteride (PROSCAR) 5 MG tablet Take 5 mg by mouth daily.   losartan (COZAAR) 25 MG tablet Take 25 mg by mouth daily.   Multiple Vitamins-Minerals (CENTRUM SILVER ADULT 50+ PO) Take 1 tablet by mouth every other day.   olmesartan (BENICAR) 20 MG tablet Take 20 mg by mouth daily.   omeprazole (PRILOSEC) 20 MG capsule Take 20 mg by mouth every morning.   Probiotic Product (PROBIOTIC GUMMIES PO) Take 1 capsule by mouth every other day.   rosuvastatin (CRESTOR) 20 MG tablet TAKE 1 TABLET BY MOUTH DAILY.   tamsulosin (FLOMAX) 0.4 MG CAPS capsule Take 0.4 mg by mouth 3 (three) times daily.     Objective:  PHYSICAL EXAMINATION:    VITALS:   Vitals:   03/14/23 0815  BP: 122/74  Pulse: (!) 51  SpO2: 96%  Weight: 184 lb (83.5 kg)  Height: 5\' 9"  (1.753 m)    Wt Readings from Last 3 Encounters:  03/14/23 184 lb (83.5 kg)  10/04/22 179 lb 6.4 oz (81.4 kg)  09/09/22 182 lb 6.4 oz (82.7 kg)   GEN:  The patient appears stated age and is in NAD. HEENT:  Normocephalic, atraumatic.  The mucous membranes are moist. The superficial temporal arteries are without ropiness or tenderness. CV:  brady.  regular Lungs:  CTAB Neck/HEME:  There are no carotid  bruits bilaterally.  Neurological examination:   Orientation: The patient is alert and oriented x3.  Cranial nerves: There is mild L facial droop.  Extraocular muscles are intact. The visual fields are full to confrontational testing. The speech is fluent and clear. Soft palate rises symmetrically and there is no tongue deviation. Hearing is intact to conversational tone. Sensation: Sensation is intact to light touch throughout  Motor: Strength is at least antigravity x 4   Movement examination: Tone: There is nl tone in the UE/LE Abnormal movements: there is rare LUE rest tremor (only with ambulation) Coordination:  There is no decremation with any form of RAMS, including alternating supination and pronation of the forearm, hand opening and closing, finger taps, heel taps and toe taps bilaterally Gait and Station: The patient has no difficulty arising out of a deep-seated chair without the use of the hands. The patient's stride length is good.  He has decreased arm swing on the L.    I have reviewed and interpreted the following labs independently    Chemistry      Component Value Date/Time   NA 142 09/29/2021 0958   K 4.2 09/29/2021 0958   CL 104 09/29/2021 0958   CO2 26 09/29/2021 0958   BUN 8 09/29/2021 0958   CREATININE 0.83 09/29/2021 0958      Component Value Date/Time   CALCIUM 9.2 09/29/2021 0958   ALKPHOS 89 12/17/2019 1245   AST 23 10/07/2022 0911   ALT 13 10/07/2022 0911   BILITOT 0.6 12/17/2019 1245       Lab Results  Component Value Date   WBC 5.0 12/17/2019   HGB 14.3 12/17/2019   HCT 43.3 12/17/2019   MCV 88.0 12/17/2019   PLT 208 12/17/2019    No results found for: "TSH"   Total time spent on today's visit was 30 minutes, including both face-to-face time and nonface-to-face time.  Time included that spent on review of records (prior notes available to me/labs/imaging if pertinent), discussing treatment and goals, answering patient's questions and  coordinating care.  Cc:  Annamaria Helling, DO

## 2023-03-14 ENCOUNTER — Ambulatory Visit (INDEPENDENT_AMBULATORY_CARE_PROVIDER_SITE_OTHER): Payer: PPO | Admitting: Neurology

## 2023-03-14 ENCOUNTER — Encounter: Payer: Self-pay | Admitting: Neurology

## 2023-03-14 VITALS — BP 122/74 | HR 51 | Ht 69.0 in | Wt 184.0 lb

## 2023-03-14 DIAGNOSIS — R001 Bradycardia, unspecified: Secondary | ICD-10-CM | POA: Diagnosis not present

## 2023-03-14 DIAGNOSIS — G20A1 Parkinson's disease without dyskinesia, without mention of fluctuations: Secondary | ICD-10-CM | POA: Diagnosis not present

## 2023-03-14 NOTE — Patient Instructions (Signed)
    As a reminder, carbidopa/levodopa can be taken at the same time as a carbohydrate, but we like to have you take your pill either 30 minutes before a protein source or 1 hour after as protein can interfere with carbidopa/levodopa absorption.  You can try Halo Top ice cream and [employer]odi[employer] brand foods for protein based foods that are healthy.  The physicians and staff at [facility] are committed to providing excellent care. You may receive a survey requesting feedback about your experience at our office. We strive to receive "very good" responses to the survey questions. If you feel that your experience would prevent you from giving the office a "very good " response, please contact our office to try to remedy the situation. We may be reached at [phone]. Thank you for taking the time out of your busy day to complete the survey.

## 2023-03-16 ENCOUNTER — Telehealth: Payer: Self-pay | Admitting: Cardiology

## 2023-03-16 ENCOUNTER — Telehealth: Payer: Self-pay

## 2023-03-16 NOTE — Telephone Encounter (Signed)
Spouse called and stated that pt saw Neurologist yesterday and his heart rate was 51. Today it was 54. His medications are the same. She stated that he reports he "feels bad". No other symptoms reported.

## 2023-03-16 NOTE — Telephone Encounter (Signed)
STAT if HR is under 50 or over 120 (normal HR is 60-100 beats per minute)  What is your heart rate? 54  Do you have a log of your heart rate readings (document readings)? 51,54   Do you have any other symptoms? Feels terrible

## 2023-03-17 NOTE — Telephone Encounter (Signed)
Spoke with pt per Dr. Vanetta Shawl note he recommended that he could try stopping the Metoprolol to see if this will help him feel better. Encouraged him to continue to check his heart rate and report any changes or problems. Pt verbalized understanding and had no further questions.

## 2023-03-17 NOTE — Telephone Encounter (Signed)
Spoke with pt. See phone note. 

## 2023-03-21 DIAGNOSIS — L814 Other melanin hyperpigmentation: Secondary | ICD-10-CM | POA: Diagnosis not present

## 2023-03-21 DIAGNOSIS — L570 Actinic keratosis: Secondary | ICD-10-CM | POA: Diagnosis not present

## 2023-03-21 DIAGNOSIS — X32XXXS Exposure to sunlight, sequela: Secondary | ICD-10-CM | POA: Diagnosis not present

## 2023-03-21 DIAGNOSIS — D1801 Hemangioma of skin and subcutaneous tissue: Secondary | ICD-10-CM | POA: Diagnosis not present

## 2023-03-21 DIAGNOSIS — Z85828 Personal history of other malignant neoplasm of skin: Secondary | ICD-10-CM | POA: Diagnosis not present

## 2023-03-21 DIAGNOSIS — L821 Other seborrheic keratosis: Secondary | ICD-10-CM | POA: Diagnosis not present

## 2023-03-21 DIAGNOSIS — L578 Other skin changes due to chronic exposure to nonionizing radiation: Secondary | ICD-10-CM | POA: Diagnosis not present

## 2023-04-21 DIAGNOSIS — D519 Vitamin B12 deficiency anemia, unspecified: Secondary | ICD-10-CM | POA: Diagnosis not present

## 2023-05-02 ENCOUNTER — Telehealth: Payer: Self-pay | Admitting: Physical Medicine and Rehabilitation

## 2023-05-02 NOTE — Telephone Encounter (Signed)
 Patient called and wants to make an appointment for back pain. CB#[phone]

## 2023-05-03 ENCOUNTER — Telehealth: Payer: Self-pay | Admitting: Physical Medicine and Rehabilitation

## 2023-05-03 NOTE — Telephone Encounter (Signed)
 Patient called and need an appointment to check his back. CB#[phone]

## 2023-05-09 ENCOUNTER — Ambulatory Visit: Payer: PPO | Admitting: Physical Medicine and Rehabilitation

## 2023-05-10 ENCOUNTER — Encounter: Payer: Self-pay | Admitting: Physical Medicine and Rehabilitation

## 2023-05-10 ENCOUNTER — Ambulatory Visit (INDEPENDENT_AMBULATORY_CARE_PROVIDER_SITE_OTHER): Payer: PPO | Admitting: Physical Medicine and Rehabilitation

## 2023-05-10 DIAGNOSIS — M47816 Spondylosis without myelopathy or radiculopathy, lumbar region: Secondary | ICD-10-CM | POA: Diagnosis not present

## 2023-05-10 DIAGNOSIS — G8929 Other chronic pain: Secondary | ICD-10-CM

## 2023-05-10 DIAGNOSIS — M545 Low back pain, unspecified: Secondary | ICD-10-CM | POA: Diagnosis not present

## 2023-05-10 NOTE — Progress Notes (Signed)
 Wesley Jackson - 83 y.o. male MRN [id number]  Date of birth: [birth year]  Office Visit Note: Visit Date: 05/10/2023 PCP: Conley Dene BROCKS, DO Referred by: Conley Dene BROCKS, DO  Subjective: Chief Complaint  Patient presents with   Lower Back - Pain   HPI: Wesley Jackson is a 83 y.o. male who comes in today for evaluation of chronic, worsening and severe bilateral lower back pain, right greater than left. Pain ongoing for several years, worsened about 5 weeks ago. His pain is exacerbated by movement and twisting, severe pain when moving from sitting to standing position, he describes pain as sore and aching sensation, currently rates as 5 out of 10. Some relief of pain with home exercise regimen, rest and use of medications. He was diagnosed with Parkinson's Disease since he was last seen in our office in 2023, he is currently managed by Dr. Asberry Tat with [facility]. He does attend exercise classes weekly that focus on gait and strengthening, does play golf up to 3 times a week. Lumbar MRI from 2023 exhibits new disc protrusion at L3-L4  effacing the right lateral recess and contacting the descending right L4 nerve roots. Right lateral disc protrusion remains at L4-L5 and is slightly progressed from prior exam. No high grade spinal canal stenosis noted. Patient underwent bilateral L4-L5 and L5-S1 medial branch blocks in our office on 02/08/2022, he reports significant relief of pain, greater than 80% for over a year, he also reports increased functional ability post injections. He has also undergone transforaminal injections at the level of both L3 and L4 with good relief of pain. Patient denies focal weakness, numbness and tingling. No recent trauma or falls.      Review of Systems  Musculoskeletal:  Positive for back pain.  Neurological:  Negative for tingling, sensory change, focal weakness and weakness.  All other systems reviewed and are negative.  Otherwise per  HPI.  Assessment & Plan: Visit Diagnoses:    ICD-10-CM   1. Chronic bilateral low back pain without sciatica  M54.50 Ambulatory referral to Physical Medicine Rehab   G89.29     2. Spondylosis of lumbar region without myelopathy or radiculopathy  M47.816 Ambulatory referral to Physical Medicine Rehab    3. Facet hypertrophy of lumbar region  M47.816 Ambulatory referral to Physical Medicine Rehab       Plan: Findings:  Chronic, worsening and severe bilateral lower back pain, right greater than left. No radicular symptoms down the legs. Patient continues to have severe pain despite good conservative therapies such as home exercise regimen, rest and use of medications. Patients clinical presentation and exam are consistent with facet mediated pain. There is facet arthropathy noted to lower lumbar spine. We discussed treatment plan in detail, next step is to perform diagnostic bilateral L4-L5 and L5-S1 facet/medial branch blocks. If good relief of pain with injections we discussed possibility of longer sustained pain relief with radiofrequency ablation. I encouraged patient to continue with exercise regimen and use of over the counter medications. If his pain presents as more radicular in nature, would consider repeating lumbar epidural steroid injections. No red flag symptoms noted upon exam today.     Meds & Orders: No orders of the defined types were placed in this encounter.   Orders Placed This Encounter  Procedures   Ambulatory referral to Physical Medicine Rehab    Follow-up: Return for Bilateral L4-L5 and L5-S1 facet/medial branch blocks.   Procedures: No procedures performed  Clinical History: EXAM: MRI LUMBAR SPINE WITHOUT CONTRAST   TECHNIQUE: Multiplanar, multisequence MR imaging of the lumbar spine was performed. No intravenous contrast was administered.   COMPARISON:  11/25/2018   FINDINGS: Segmentation:  Standard.   Alignment:  Trace retrolisthesis L2 on L3.    Vertebrae:  No fracture, evidence of discitis, or bone lesion.   Conus medullaris and cauda equina: Conus extends to the L1 level. Conus and cauda equina appear normal.   Paraspinal and other soft tissues: Negative.   Disc levels:   T12-L1: No significant disc bulge. No spinal canal stenosis or neural foraminal narrowing.   L1-L2: Looks field minimal disc bulge. Mild facet arthropathy. No spinal canal stenosis or neural foraminal narrowing.   L2-L3: Trace retrolisthesis. Mild disc bulge. Mild facet arthropathy. No spinal canal stenosis or neural foraminal narrowing.   L3-L4: Mild disc bulge with right subarticular and foraminal disc protrusion, new from the prior exam, which effaces the right lateral recess and contacts the descending right L4 nerve roots. No spinal canal stenosis. Mild left and moderate right neural foraminal narrowing, which have progressed slightly from the prior exam.   L4-L5: Mild disc bulge with slightly increased right foraminal and extreme lateral disc protrusion, which displaces the exiting right L4 nerve roots, slightly progressed from the prior exam. Narrowing of the lateral recesses. Mild facet arthropathy. No spinal canal stenosis. Moderate right neural foraminal narrowing.   L5-S1: Mild disc bulge. Mild facet arthropathy. No spinal canal stenosis. Mild left neural foraminal narrowing, unchanged.   IMPRESSION: 1. L3-L4 disc protrusion effaces the right lateral recess and contacts the descending right L4 nerve roots, new from the prior exam. Moderate right and mild left neural foraminal narrowing at this level have progressed slightly from the prior exam. 2. L4-L5 right extreme lateral disc protrusion displaces the exiting right L4 nerve roots slightly progressed from the prior exam. Unchanged moderate right neural foraminal narrowing. Narrowing of the lateral recesses at this level could affect the descending L5 nerve roots. 3. L5-S1 mild  left neural foraminal narrowing, unchanged.     Electronically Signed   By: Donald Campion M.D.   On: 11/16/2021 11:31   He reports that he has never smoked. He has never used smokeless tobacco. No results for input(s): HGBA1C, LABURIC in the last 8760 hours.  Objective:  VS:  HT:    WT:   BMI:     BP:   HR: bpm  TEMP: ( )  RESP:  Physical Exam Vitals and nursing note reviewed.  HENT:     Head: Normocephalic and atraumatic.     Right Ear: External ear normal.     Left Ear: External ear normal.     Nose: Nose normal.     Mouth/Throat:     Mouth: Mucous membranes are moist.  Eyes:     Extraocular Movements: Extraocular movements intact.  Cardiovascular:     Rate and Rhythm: Normal rate.     Pulses: Normal pulses.  Pulmonary:     Effort: Pulmonary effort is normal.  Abdominal:     General: Abdomen is flat. There is no distension.  Musculoskeletal:        General: Tenderness present.     Cervical back: Normal range of motion.     Comments: Patient rises from seated position to standing without difficulty. Pain noted with facet loading and lumbar extension. 5/5 strength noted with bilateral hip flexion, knee flexion/extension, ankle dorsiflexion/plantarflexion and EHL. No clonus noted bilaterally. No pain  upon palpation of greater trochanters. No pain with internal/external rotation of bilateral hips. Sensation intact bilaterally. Negative slump test bilaterally. Ambulates without aid, gait steady.     Skin:    General: Skin is warm and dry.     Capillary Refill: Capillary refill takes less than 2 seconds.  Neurological:     General: No focal deficit present.     Mental Status: He is alert and oriented to person, place, and time.  Psychiatric:        Mood and Affect: Mood normal.        Behavior: Behavior normal.     Ortho Exam  Imaging: No results found.  Past Medical/Family/Surgical/Social History: Medications & Allergies reviewed per EMR, new medications  updated. 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   Coronary artery disease 50 to 69% stenosis of the mid LAD however FFR negative based on coronary CT angio 11/24/2021   PVC's (premature ventricular contractions) 11/24/2021   Eye problem 11/20/2021   Hypertension 11/20/2021   Cellulitis 10/30/2021   Open wound of lower extremity 10/30/2021   Atypical chest pain 09/22/2021   Dyslipidemia 09/22/2021   Skin sloughing (HCC) 12/02/2020   Erysipelas of left lower extremity 11/25/2020   Malignant neoplasm of prostate (HCC) 09/18/2019   Lumbar radiculopathy 11/28/2018   Hypertrophy of inferior nasal turbinate 01/21/2015   Nasal congestion with rhinorrhea 01/21/2015   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   Past Medical History:  Diagnosis Date   Arthritis    herniated disc lower back took injection 2017, occ lower back spams had epidural 2020   Atypical chest pain 09/22/2021   BPH (benign prostatic hyperplasia)    Cellulitis 10/30/2021   Dyslipidemia 09/22/2021   Dyspnea on exertion 09/22/2021   Eye problem 11/20/2021   GERD (gastroesophageal reflux disease)    Hypertension    Nasal congestion with rhinorrhea 01/21/2015   Open wound of lower extremity 10/30/2021   Prostate cancer (HCC) 2021   TIA (transient ischemic attack)    Family History  Problem Relation Age of Onset   COPD Mother    Diabetes Father    Heart attack Father    Prostate cancer Neg Hx    Colon cancer Neg Hx    Pancreatic cancer Neg Hx    Breast cancer Neg Hx    Past Surgical History:  Procedure Laterality Date   ACHILLES TENDON REPAIR Left yrs ago   ARTHROSCOPY KNEE W/ DRILLING Right yrs ago   CYSTOSCOPY N/A 12/20/2019   Procedure: CYSTOSCOPY FLEXIBLE;  Surgeon: Marda General, MD;  Location: [facility];  Service: Urology;  Laterality: N/A;   EYE SURGERY Bilateral 2019   cataracts both eyes    HERNIA REPAIR Bilateral yrs ago   inguinal hernia    NASAL TURBINATE REDUCTION  05/2019   PROSTATE BIOPSY  2021   RADIOACTIVE SEED IMPLANT N/A 12/20/2019   Procedure: RADIOACTIVE SEED IMPLANT/BRACHYTHERAPY IMPLANT;  Surgeon: Marda General, MD;  Location[facility];  Service: Urology;  Laterality: N/A;   skin biopsies, nose  08/2019   SPACE OAR INSTILLATION N/A 12/20/2019   Procedure: SPACE OAR INSTILLATION;  Surgeon: Marda General, MD;  Location: [facility];  Service: Urology;  Laterality: N/A;   Social History   Occupational History   Occupation: retired    Comment: stage manager  Tobacco Use   Smoking status: Never   Smokeless tobacco: Never  Vaping Use   Vaping status: Never Used  Substance and Sexual Activity   Alcohol use: Yes    Alcohol/week: 1.0 standard drink of alcohol    Types: 1 Cans of beer per week    Comment: 1 beer every other month   Drug use: No   Sexual activity: Not Currently

## 2023-05-10 NOTE — Progress Notes (Signed)
Lower back pain to discuss injection.  R> L  He is taking ibuprofen and he stated that it is helping some.

## 2023-05-11 ENCOUNTER — Other Ambulatory Visit: Payer: Self-pay | Admitting: Neurology

## 2023-05-23 ENCOUNTER — Encounter: Payer: PPO | Admitting: Physical Medicine and Rehabilitation

## 2023-05-24 ENCOUNTER — Other Ambulatory Visit: Payer: Self-pay

## 2023-05-24 ENCOUNTER — Ambulatory Visit: Payer: PPO | Admitting: Physical Medicine and Rehabilitation

## 2023-05-24 DIAGNOSIS — M47816 Spondylosis without myelopathy or radiculopathy, lumbar region: Secondary | ICD-10-CM

## 2023-05-24 MED ORDER — BUPIVACAINE HCL 0.5 % IJ SOLN
3.0000 mL | Freq: Once | INTRAMUSCULAR | Status: AC
  Administered 2023-05-24: 3 mL

## 2023-05-24 NOTE — Progress Notes (Unsigned)
Functional Pain Scale - descriptive words and definitions  Moderate (4)   Constantly aware of pain, can complete ADLs with modification/sleep marginally affected at times/passive distraction is of no use, but active distraction gives some relief. Moderate range order  Average Pain {NUMBERS; 0-10:5044}  148/76 +Driver, -BT, -Dye Allergies.

## 2023-05-24 NOTE — Patient Instructions (Signed)

## 2023-05-26 NOTE — Progress Notes (Signed)
ERIVERTO BYRNES - 83 y.o. male MRN [id number]  Date of birth: January 14, 1941  Office Visit Note: Visit Date: 05/24/2023 PCP: Annamaria Helling, DO Referred by: Annamaria Helling, DO  Subjective: Chief Complaint  Patient presents with   Lower Back - Pain   HPI:  Wesley Jackson is a 83 y.o. male who comes in today for planned repeat Bilateral L4-5 and L5-S1 Lumbar facet/medial branch block with fluoroscopic guidance.  The patient has failed conservative care including home exercise, medications, time and activity modification.  This injection will be diagnostic and hopefully therapeutic.  Please see requesting physician notes for further details and justification.  Exam shows concordant low back pain with facet joint loading and extension. Patient received more than 80% pain relief from prior injection. This would be the second block in a diagnostic double block paradigm.     Referring:Megan Mayford Knife, FNP   ROS Otherwise per HPI.  Assessment & Plan: Visit Diagnoses:    ICD-10-CM   1. Spondylosis of lumbar region without myelopathy or radiculopathy  M47.816 XR C-ARM NO REPORT    Facet Injection    bupivacaine (MARCAINE) 0.5 % (with pres) injection 3 mL      Plan: No additional findings.   Meds & Orders:  Meds ordered this encounter  Medications   bupivacaine (MARCAINE) 0.5 % (with pres) injection 3 mL    Orders Placed This Encounter  Procedures   Facet Injection   XR C-ARM NO REPORT    Follow-up: Return if symptoms worsen or fail to improve.   Procedures: No procedures performed  Lumbar Diagnostic Facet Joint Nerve Block with Fluoroscopic Guidance   Patient: Wesley Jackson      Date of Birth: 1940/11/29 MRN: [id number] PCP: Annamaria Helling, DO      Visit Date: 05/24/2023   Universal Protocol:    Date/Time: 01/30/256:25 PM  Consent Given By: the patient  Position: PRONE  Additional Comments: Vital signs were monitored before and after the procedure. Patient  was prepped and draped in the usual sterile fashion. The correct patient, procedure, and site was verified.   Injection Procedure Details:   Procedure diagnoses:  1. Spondylosis of lumbar region without myelopathy or radiculopathy      Meds Administered:  Meds ordered this encounter  Medications   bupivacaine (MARCAINE) 0.5 % (with pres) injection 3 mL     Laterality: Bilateral  Location/Site: L4-L5, L3 and L4 medial branches and L5-S1, L4 medial branch and L5 dorsal ramus  Needle: 5.0 in., 25 ga.  Short bevel or Quincke spinal needle  Needle Placement: Oblique pedical  Findings:   -Comments: There was excellent flow of contrast along the articular pillars without intravascular flow.  Procedure Details: The fluoroscope beam is vertically oriented in AP and then obliqued 15 to 20 degrees to the ipsilateral side of the desired nerve to achieve the "Scotty dog" appearance.  The skin over the target area of the junction of the superior articulating process and the transverse process (sacral ala if blocking the L5 dorsal rami) was locally anesthetized with a 1 ml volume of 1% Lidocaine without Epinephrine.  The spinal needle was inserted and advanced in a trajectory view down to the target.   After contact with periosteum and negative aspirate for blood and CSF, correct placement without intravascular or epidural spread was confirmed by injecting 0.5 ml. of Isovue-250.  A spot radiograph was obtained of this image.    Next, a 0.5 ml. volume of  the injectate described above was injected. The needle was then redirected to the other facet joint nerves mentioned above if needed.  Prior to the procedure, the patient was given a Pain Diary which was completed for baseline measurements.  After the procedure, the patient rated their pain every 30 minutes and will continue rating at this frequency for a total of 5 hours.  The patient has been asked to complete the Diary and return to [place] by mail,  fax or hand delivered as soon as possible.   Additional Comments:  The patient tolerated the procedure well Dressing: 2 x 2 sterile gauze and Band-Aid    Post-procedure details: Patient was observed during the procedure. Post-procedure instructions were reviewed.  Patient left the clinic in stable condition.   Clinical History: EXAM: MRI LUMBAR SPINE WITHOUT CONTRAST   TECHNIQUE: Multiplanar, multisequence MR imaging of the lumbar spine was performed. No intravenous contrast was administered.   COMPARISON:  11/25/2018   FINDINGS: Segmentation:  Standard.   Alignment:  Trace retrolisthesis L2 on L3.   Vertebrae:  No fracture, evidence of discitis, or bone lesion.   Conus medullaris and cauda equina: Conus extends to the L1 level. Conus and cauda equina appear normal.   Paraspinal and other soft tissues: Negative.   Disc levels:   T12-L1: No significant disc bulge. No spinal canal stenosis or neural foraminal narrowing.   L1-L2: Looks field minimal disc bulge. Mild facet arthropathy. No spinal canal stenosis or neural foraminal narrowing.   L2-L3: Trace retrolisthesis. Mild disc bulge. Mild facet arthropathy. No spinal canal stenosis or neural foraminal narrowing.   L3-L4: Mild disc bulge with right subarticular and foraminal disc protrusion, new from the prior exam, which effaces the right lateral recess and contacts the descending right L4 nerve roots. No spinal canal stenosis. Mild left and moderate right neural foraminal narrowing, which have progressed slightly from the prior exam.   L4-L5: Mild disc bulge with slightly increased right foraminal and extreme lateral disc protrusion, which displaces the exiting right L4 nerve roots, slightly progressed from the prior exam. Narrowing of the lateral recesses. Mild facet arthropathy. No spinal canal stenosis. Moderate right neural foraminal narrowing.   L5-S1: Mild disc bulge. Mild facet arthropathy. No spinal  canal stenosis. Mild left neural foraminal narrowing, unchanged.   IMPRESSION: 1. L3-L4 disc protrusion effaces the right lateral recess and contacts the descending right L4 nerve roots, new from the prior exam. Moderate right and mild left neural foraminal narrowing at this level have progressed slightly from the prior exam. 2. L4-L5 right extreme lateral disc protrusion displaces the exiting right L4 nerve roots slightly progressed from the prior exam. Unchanged moderate right neural foraminal narrowing. Narrowing of the lateral recesses at this level could affect the descending L5 nerve roots. 3. L5-S1 mild left neural foraminal narrowing, unchanged.     Electronically Signed   By: Wiliam Ke M.D.   On: 11/16/2021 11:31     Objective:  VS:  HT:    WT:   BMI:     BP:   HR: bpm  TEMP: ( )  RESP:  Physical Exam Vitals and nursing note reviewed.  Constitutional:      General: He is not in acute distress.    Appearance: Normal appearance. He is well-developed. He is not ill-appearing.  HENT:     Head: Normocephalic and atraumatic.     Right Ear: External ear normal.     Left Ear: External ear normal.  Nose: No congestion.  Eyes:     Extraocular Movements: Extraocular movements intact.     Conjunctiva/sclera: Conjunctivae normal.     Pupils: Pupils are equal, round, and reactive to light.  Cardiovascular:     Rate and Rhythm: Normal rate.     Pulses: Normal pulses.     Heart sounds: Normal heart sounds.  Pulmonary:     Effort: Pulmonary effort is normal. No respiratory distress.  Abdominal:     General: There is no distension.     Palpations: Abdomen is soft.  Musculoskeletal:        General: No tenderness or signs of injury.     Cervical back: Normal range of motion and neck supple. No rigidity.     Right lower leg: No edema.     Left lower leg: No edema.     Comments: Patient has good distal strength without clonus.  Skin:    General: Skin is warm and  dry.     Findings: No erythema or rash.  Neurological:     General: No focal deficit present.     Mental Status: He is alert and oriented to person, place, and time.     Sensory: No sensory deficit.     Motor: No weakness or abnormal muscle tone.     Coordination: Coordination normal.     Gait: Gait normal.  Psychiatric:        Mood and Affect: Mood normal.        Behavior: Behavior normal.      Imaging: No results found.

## 2023-05-26 NOTE — Procedures (Signed)
Lumbar Diagnostic Facet Joint Nerve Block with Fluoroscopic Guidance   Patient: Wesley Jackson      Date of Birth: 1940-04-28 MRN: [id number] PCP: Annamaria Helling, DO      Visit Date: 05/24/2023   Universal Protocol:    Date/Time: 01/30/256:25 PM  Consent Given By: the patient  Position: PRONE  Additional Comments: Vital signs were monitored before and after the procedure. Patient was prepped and draped in the usual sterile fashion. The correct patient, procedure, and site was verified.   Injection Procedure Details:   Procedure diagnoses:  1. Spondylosis of lumbar region without myelopathy or radiculopathy      Meds Administered:  Meds ordered this encounter  Medications   bupivacaine (MARCAINE) 0.5 % (with pres) injection 3 mL     Laterality: Bilateral  Location/Site: L4-L5, L3 and L4 medial branches and L5-S1, L4 medial branch and L5 dorsal ramus  Needle: 5.0 in., 25 ga.  Short bevel or Quincke spinal needle  Needle Placement: Oblique pedical  Findings:   -Comments: There was excellent flow of contrast along the articular pillars without intravascular flow.  Procedure Details: The fluoroscope beam is vertically oriented in AP and then obliqued 15 to 20 degrees to the ipsilateral side of the desired nerve to achieve the "Scotty dog" appearance.  The skin over the target area of the junction of the superior articulating process and the transverse process (sacral ala if blocking the L5 dorsal rami) was locally anesthetized with a 1 ml volume of 1% Lidocaine without Epinephrine.  The spinal needle was inserted and advanced in a trajectory view down to the target.   After contact with periosteum and negative aspirate for blood and CSF, correct placement without intravascular or epidural spread was confirmed by injecting 0.5 ml. of Isovue-250.  A spot radiograph was obtained of this image.    Next, a 0.5 ml. volume of the injectate described above was injected. The  needle was then redirected to the other facet joint nerves mentioned above if needed.  Prior to the procedure, the patient was given a Pain Diary which was completed for baseline measurements.  After the procedure, the patient rated their pain every 30 minutes and will continue rating at this frequency for a total of 5 hours.  The patient has been asked to complete the Diary and return to [place] by mail, fax or hand delivered as soon as possible.   Additional Comments:  The patient tolerated the procedure well Dressing: 2 x 2 sterile gauze and Band-Aid    Post-procedure details: Patient was observed during the procedure. Post-procedure instructions were reviewed.  Patient left the clinic in stable condition.

## 2023-07-12 ENCOUNTER — Telehealth: Payer: Self-pay | Admitting: Cardiology

## 2023-07-12 NOTE — Telephone Encounter (Signed)
 Pt c/o BP issue: STAT if pt c/o blurred vision, one-sided weakness or slurred speech.  STAT if BP is GREATER than 180/120 TODAY.  STAT if BP is LESS than 90/60 and SYMPTOMATIC TODAY  1. What is your BP concern?   Wife stated patient has been having high BP readings  2. Have you taken any BP medication today?  Yes  3. What are your last 5 BP readings?  152/81  HR 44 (today)  4. Are you having any other symptoms (ex. Dizziness, headache, blurred vision, passed out)?  Lack of energy  Wife stated patient has been having high BP readings and will be having eye surgery on 4/9.  Patient has appointment scheduled on 3/27.

## 2023-07-12 NOTE — Telephone Encounter (Signed)
 Pt states that someone called her already.

## 2023-07-21 ENCOUNTER — Encounter: Payer: Self-pay | Admitting: Cardiology

## 2023-07-21 ENCOUNTER — Ambulatory Visit: Attending: Cardiology

## 2023-07-21 ENCOUNTER — Ambulatory Visit: Attending: Cardiology | Admitting: Cardiology

## 2023-07-21 VITALS — BP 124/74 | HR 52 | Ht 69.0 in | Wt 190.8 lb

## 2023-07-21 DIAGNOSIS — I472 Ventricular tachycardia, unspecified: Secondary | ICD-10-CM | POA: Insufficient documentation

## 2023-07-21 DIAGNOSIS — R0609 Other forms of dyspnea: Secondary | ICD-10-CM | POA: Diagnosis not present

## 2023-07-21 DIAGNOSIS — G20A1 Parkinson's disease without dyskinesia, without mention of fluctuations: Secondary | ICD-10-CM

## 2023-07-21 DIAGNOSIS — R002 Palpitations: Secondary | ICD-10-CM | POA: Diagnosis not present

## 2023-07-21 DIAGNOSIS — C61 Malignant neoplasm of prostate: Secondary | ICD-10-CM

## 2023-07-21 DIAGNOSIS — I493 Ventricular premature depolarization: Secondary | ICD-10-CM | POA: Diagnosis not present

## 2023-07-21 DIAGNOSIS — I10 Essential (primary) hypertension: Secondary | ICD-10-CM | POA: Diagnosis not present

## 2023-07-21 DIAGNOSIS — I2510 Atherosclerotic heart disease of native coronary artery without angina pectoris: Secondary | ICD-10-CM

## 2023-07-21 MED ORDER — METOPROLOL TARTRATE 25 MG PO TABS: 12.5000 mg | ORAL_TABLET | Freq: Two times a day (BID) | ORAL | 3 refills | Status: AC

## 2023-07-21 NOTE — Patient Instructions (Addendum)
 Medication Instructions:   DECREASE: Metoprolol to 1/2 tablet twice daily   Lab Work: None Ordered If you have labs (blood work) drawn today and your tests are completely normal, you will receive your results only by: MyChart Message (if you have MyChart) OR A paper copy in the mail If you have any lab test that is abnormal or we need to change your treatment, we will call you to review the results.   Testing/Procedures: Your physician has requested that you have an echocardiogram. Echocardiography is a painless test that uses sound waves to create images of your heart. It provides your doctor with information about the size and shape of your heart and how well your heart's chambers and valves are working. This procedure takes approximately one hour. There are no restrictions for this procedure. Please do NOT wear cologne, perfume, aftershave, or lotions (deodorant is allowed). Please arrive 15 minutes prior to your appointment time.  Please note: We ask at that you not bring children with you during ultrasound (echo/ vascular) testing. Due to room size and safety concerns, children are not allowed in the ultrasound rooms during exams. Our front office staff cannot provide observation of children in our lobby area while testing is being conducted. An adult accompanying a patient to their appointment will only be allowed in the ultrasound room at the discretion of the ultrasound technician under special circumstances. We apologize for any inconvenience.    Follow-Up: At [facility], you and your health needs are our priority.  As part of our continuing mission to provide you with exceptional heart care, we have created designated Provider Care Teams.  These Care Teams include your primary Cardiologist (physician) and Advanced Practice Providers (APPs -  Physician Assistants and Nurse Practitioners) who all work together to provide you with the care you need, when you need it.  We recommend  signing up for the patient portal called "MyChart".  Sign up information is provided on this After Visit Summary.  MyChart is used to connect with patients for Virtual Visits (Telemedicine).  Patients are able to view lab/test results, encounter notes, upcoming appointments, etc.  Non-urgent messages can be sent to your provider as well.   To learn more about what you can do with MyChart, go to [email or web address].    Your next appointment:   6 month(s)  The format for your next appointment:   In Person  Provider:   Gypsy Balsam, MD    Other Instructions NA

## 2023-07-21 NOTE — Progress Notes (Signed)
 Cardiology Office Note:    Date:  07/21/2023   ID:  JOSHAU CODE, DOB 10/27/1940, MRN [id number]  PCP:  Annamaria Helling, DO  Cardiologist:  Gypsy Balsam, MD    Referring MD: Annamaria Helling, DO   Chief Complaint  Patient presents with   Follow-up    History of Present Illness:    Wesley Jackson is a 83 y.o. male past medical history significant for coronary artery disease, he did have coronary CT angio showed moderate disease FFR was negative, ventricular tachycardia, preserved ejection fraction, mild to moderate mitral regurgitation, Parkinson disease.  He comes today to my office for follow-up.  Overall cardiac wise doing fine complain having weakness fatigue he complaining for Parkinson which bothers him a lot.  He is getting ready to have some eyelid surgery which will be done on April 15.  He is still participating in boxing class and he enjoyed that.  Denies have any chest pain tightness squeezing pressure burning chest no shortness of breath just fatigue  Past Medical History:  Diagnosis Date   Arthritis    herniated disc lower back took injection 2017, occ lower back spams had epidural 2020   Atypical chest pain 09/22/2021   BPH (benign prostatic hyperplasia)    Cellulitis 10/30/2021   Dyslipidemia 09/22/2021   Dyspnea on exertion 09/22/2021   Eye problem 11/20/2021   GERD (gastroesophageal reflux disease)    Hypertension    Nasal congestion with rhinorrhea 01/21/2015   Open wound of lower extremity 10/30/2021   Prostate cancer (HCC) 2021   TIA (transient ischemic attack)     Past Surgical History:  Procedure Laterality Date   ACHILLES TENDON REPAIR Left yrs ago   ARTHROSCOPY KNEE W/ DRILLING Right yrs ago   CYSTOSCOPY N/A 12/20/2019   Procedure: CYSTOSCOPY FLEXIBLE;  Surgeon: Debroah Baller, MD;  Location: [facility];  Service: Urology;  Laterality: N/A;   EYE SURGERY Bilateral 2019   cataracts both eyes   HERNIA REPAIR Bilateral yrs ago    inguinal hernia    NASAL TURBINATE REDUCTION  05/2019   PROSTATE BIOPSY  2021   RADIOACTIVE SEED IMPLANT N/A 12/20/2019   Procedure: RADIOACTIVE SEED IMPLANT/BRACHYTHERAPY IMPLANT;  Surgeon: Debroah Baller, MD;  Location: [facility];  Service: Urology;  Laterality: N/A;   skin biopsies, nose  08/2019   SPACE OAR INSTILLATION N/A 12/20/2019   Procedure: SPACE OAR INSTILLATION;  Surgeon: Debroah Baller, MD;  Location: [facility];  Service: Urology;  Laterality: N/A;    Current Medications: Current Meds  Medication Sig   aspirin EC 81 MG tablet Take 1 tablet (81 mg total) by mouth daily. Swallow whole.   carbidopa-levodopa (SINEMET IR) 25-100 MG tablet TAKE 1 TABLET BY MOUTH THREE TIMES DAILY. (7AM/11AM/4PM) (Patient taking differently: Take 0.5 tablets by mouth 3 (three) times daily.)   finasteride (PROSCAR) 5 MG tablet Take 5 mg by mouth daily.   losartan (COZAAR) 25 MG tablet Take 25 mg by mouth daily.   metoprolol tartrate (LOPRESSOR) 25 MG tablet Take 1 tablet (25 mg total) by mouth 2 (two) times daily.   omeprazole (PRILOSEC) 20 MG capsule Take 20 mg by mouth every morning.   rosuvastatin (CRESTOR) 20 MG tablet TAKE 1 TABLET BY MOUTH DAILY.   tadalafil (CIALIS) 5 MG tablet Take 5 mg by mouth daily as needed for erectile dysfunction. ED   tamsulosin (FLOMAX) 0.4 MG CAPS capsule Take 0.4 mg by mouth 3 (three) times daily.   [  DISCONTINUED] acetaminophen (TYLENOL) 500 MG tablet Take 500 mg by mouth every 6 (six) hours as needed for mild pain or moderate pain.   [DISCONTINUED] Multiple Vitamins-Minerals (CENTRUM SILVER ADULT 50+ PO) Take 1 tablet by mouth every other day.   [DISCONTINUED] olmesartan (BENICAR) 20 MG tablet Take 20 mg by mouth daily.   [DISCONTINUED] Probiotic Product (PROBIOTIC GUMMIES PO) Take 1 capsule by mouth every other day.     Allergies:   Patient has no known allergies.   Social History   Socioeconomic History   Marital status:  Married    Spouse name: Not on file   Number of children: Not on file   Years of education: Not on file   Highest education level: Not on file  Occupational History   Occupation: retired    Comment: Stage manager  Tobacco Use   Smoking status: Never   Smokeless tobacco: Never  Vaping Use   Vaping status: Never Used  Substance and Sexual Activity   Alcohol use: Yes    Alcohol/week: 1.0 standard drink of alcohol    Types: 1 Cans of beer per week    Comment: 1 beer every other month   Drug use: No   Sexual activity: Not Currently  Other Topics Concern   Not on file  Social History Narrative   Left and right handed    Retired    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 There is no history of Prostate cancer, Colon cancer, Pancreatic cancer, or Breast cancer. ROS:   Please see the history of present illness.    All 14 point review of systems negative except as described per history of present illness  EKGs/Labs/Other Studies Reviewed:    EKG Interpretation Date/Time:  Thursday July 21 2023 08:03:29 EDT Ventricular Rate:  52 PR Interval:  126 QRS Duration:  108 QT Interval:  430 QTC Calculation: 399 R Axis:   -42  Text Interpretation: Sinus bradycardia Left axis deviation Abnormal ECG When compared with ECG of 01-Nov-2019 10:05, Criteria for Septal infarct are no longer Present Confirmed by Gypsy Balsam [phone]) on 07/21/2023 8:11:25 AM    Recent Labs: 10/07/2022: ALT 13  Recent Lipid Panel    Component Value Date/Time   CHOL 128 10/07/2022 0911   TRIG 52 10/07/2022 0911   HDL 59 10/07/2022 0911   CHOLHDL 2.2 10/07/2022 0911   LDLCALC 57 10/07/2022 0911    LDLDIRECT 100 (H) 11/24/2021 1501    Physical Exam:    VS:  BP 124/74 (BP Location: Right Arm, Patient Position: Sitting)   Pulse (!) 52   Ht 5\' 9"  (1.753 m)   Wt 190 lb 12.8 oz (86.5 kg)   SpO2 93%   BMI 28.18 kg/m     Wt Readings from Last 3 Encounters:  07/21/23 190 lb 12.8 oz (86.5 kg)  03/14/23 184 lb (83.5 kg)  10/04/22 179 lb 6.4 oz (81.4 kg)     GEN:  Well nourished, well developed in no acute distress HEENT: Normal NECK: No JVD; No carotid bruits LYMPHATICS: No lymphadenopathy CARDIAC: RRR, no murmurs, no rubs, no gallops RESPIRATORY:  Clear to auscultation without rales, wheezing or rhonchi  ABDOMEN: Soft, non-tender, non-distended MUSCULOSKELETAL:  No edema; No deformity  SKIN: Warm and dry LOWER EXTREMITIES: no swelling NEUROLOGIC:  Alert and oriented x 3 PSYCHIATRIC:  Normal affect   ASSESSMENT:    1. Palpitations   2. Coronary artery disease involving native coronary artery of native heart without angina pectoris   3. PVC's (premature ventricular contractions)   4. Primary hypertension   5. Parkinson's disease, unspecified whether dyskinesia present, unspecified whether manifestations fluctuate (HCC)   6. Malignant neoplasm of prostate (HCC)   7. Ventricular tachycardia (HCC)    PLAN:    In order of problems listed above:  Coronary disease stable from that point review denies have any signs and symptoms of worsening of the condition.  He is on antiplatelet therapy which I will continue. Dyslipidemia I did review K PN which show me data from summer of last year LDL 57 HDL 59 continue high intensity statin for Crestor 20. Parkinson disease present obviously and getting slightly worse but still participate in boxing classes. Mitral regurgitation: Will schedule him to have echocardiogram. History of ventricular tachycardia no dizziness no passing out he is taking metoprolol however he is bradycardic and complained of having weakness fatigue.  I will ask  him to have echocardiogram done if echocardiogram showing a preserved left ventricle ejection fraction we try to cut down his metoprolol.  I will also cut down metoprolol now before his surgery morning but surgery will be postponed because of bradycardia. Mitral regurgitation moderate based on last assessment by echocardiogram echocardiogram will be repeated   Medication Adjustments/Labs and Tests Ordered: Current medicines are reviewed at length with the patient today.  Concerns regarding medicines are outlined above.  Orders Placed This Encounter  Procedures   EKG 12-Lead   Medication changes: No orders of the defined types were placed in this encounter.   Signed, Georgeanna Lea, MD, [facility] 07/21/2023 8:30 AM     [facility]

## 2023-07-22 LAB — ECHOCARDIOGRAM COMPLETE
AR max vel: 2.58 cm2
AV Area VTI: 2.61 cm2
AV Area mean vel: 2.67 cm2
AV Mean grad: 4.6 mmHg
AV Peak grad: 10 mmHg
Ao pk vel: 1.58 m/s
Area-P 1/2: 2.9 cm2
Height: 69 in
MV M vel: 5.31 m/s
MV Peak grad: 112.8 mmHg
Radius: 0.45 cm
S' Lateral: 3.3 cm
Weight: 3052.8 [oz_av]

## 2023-08-05 ENCOUNTER — Telehealth: Payer: Self-pay

## 2023-08-05 NOTE — Telephone Encounter (Signed)
-----   Message from Gypsy Balsam sent at 07/28/2023  9:39 AM EDT ----- Echocardiogram showed preserved left ventricle ejection fraction, mild mitral valve regurgitation, overall looks fine

## 2023-08-05 NOTE — Telephone Encounter (Signed)
 LM to return my call.

## 2023-08-09 ENCOUNTER — Telehealth: Payer: Self-pay | Admitting: Cardiology

## 2023-08-09 HISTORY — PX: EYELID LACERATION REPAIR: SHX1564

## 2023-08-09 NOTE — Telephone Encounter (Signed)
 Patient and wife returned staff call regarding results.

## 2023-08-09 NOTE — Telephone Encounter (Signed)
 Spoke with Martha(on DPR) notified of results.

## 2023-08-09 NOTE — Telephone Encounter (Signed)
 Patient calling back to get results of test

## 2023-08-09 NOTE — Telephone Encounter (Signed)
 Left vm to return call.

## 2023-08-12 NOTE — Telephone Encounter (Signed)
 Completed in another call

## 2023-08-18 ENCOUNTER — Other Ambulatory Visit: Payer: Self-pay | Admitting: Neurology

## 2023-08-18 NOTE — Telephone Encounter (Signed)
 Patient has not been seen since 03/14/23 Scheduled to be seen on 03/14/23 approval to fill meds

## 2023-08-22 ENCOUNTER — Other Ambulatory Visit: Payer: Self-pay | Admitting: Cardiology

## 2023-08-26 HISTORY — PX: EYELID LACERATION REPAIR: SHX1564

## 2023-09-08 NOTE — Progress Notes (Signed)
 Assessment/Plan:   1.  Parkinsons Disease, diagnosed January, 2024  -Increase carbidopa /levodopa  25/100, 1.5 tablets tid.  Looks underdosed, and likely worse b/c of inability to exercise due to eye surgeries  -handicap placard filled out  -We discussed that it used to be thought that levodopa  would increase risk of melanoma but now it is believed that Parkinsons itself likely increases risk of melanoma. he is to get regular skin checks.  He is seeing dermatology in [place]  2.  Possible TIA, November 2023.  -Workup negative, including MRI brain with and without gadolinium in January, 2024 (done because of continued left facial droop).             -Patient on aspirin  and Crestor , 20 mg daily.  His LDL was only 35 and total cholesterol was actually quite low at 85.  3. Hx of Lightheadness  -doing well right now  -on losartan, tamsulosin, metoprolol .  He is off of the amplodipine b/c of swelling  -he is bradycardic now with the addition of metoprolol .  He is on it for runs of Vtach on zio patch.  Sees Dr. Kraskowski  4.  Joint pain and weakness  -they were attributing these "attacks" to Parkinsons Disease but I don't think that this is the case.  I told him to take extra 1/2-1 tablet of levodopa  when he has it to see if it helps but I'm doubtful as its back and knee pain with weakness.  F/u pcp if levodopa  not helpful  Subjective:   Wesley Jackson was seen today in follow up for Parkinsons disease, diagnosed last visit.  My previous records were reviewed prior to todays visit as well as outside records available to me.  This patient is accompanied in the office by his spouse who supplements the history.  Patient remains on his levodopa  3 times per day.  He has had no falls.  No lightheadedness or near syncope.   He saw his cardiologist March 27.  Notes are reviewed.  He had lower lid blepharoplasty x 2 and he reports that it didn't go well.  He was told that the skin was too thin to hold the  stitches and then he needed a skin graft.  He hasn't been able to go to [facility] because of this.  This started 4/15.  Wife notes "attacks of Parkinsons" that are described as "attacks of joint pain and weakness."  Current prescribed movement disorder medications: Carbidopa /levodopa  25/100, 1 tablet 3 times per day   PREVIOUS MEDICATIONS: Sinemet   ALLERGIES:  No Known Allergies  CURRENT MEDICATIONS:  Current Meds  Medication Sig   aspirin  EC 81 MG tablet Take 1 tablet (81 mg total) by mouth daily. Swallow whole.   carbidopa -levodopa  (SINEMET  IR) 25-100 MG tablet TAKE 1 TABLET BY MOUTH THREE TIMES DAILY. (7AM/11AM/4PM)   finasteride (PROSCAR) 5 MG tablet Take 5 mg by mouth daily.   losartan (COZAAR) 25 MG tablet Take 25 mg by mouth daily.   metoprolol  tartrate (LOPRESSOR ) 25 MG tablet Take 0.5 tablets (12.5 mg total) by mouth 2 (two) times daily.   omeprazole (PRILOSEC) 20 MG capsule Take 20 mg by mouth every morning.   rosuvastatin  (CRESTOR ) 20 MG tablet Take 1 tablet (20 mg total) by mouth daily.   tadalafil (CIALIS) 5 MG tablet Take 5 mg by mouth daily as needed for erectile dysfunction. ED   tamsulosin (FLOMAX) 0.4 MG CAPS capsule Take 0.4 mg by mouth 3 (three) times daily.     Objective:  PHYSICAL EXAMINATION:    VITALS:   Vitals:   09/12/23 0808  BP: 124/63  Pulse: 62  SpO2: 97%  Weight: 186 lb (84.4 kg)     Wt Readings from Last 3 Encounters:  09/12/23 186 lb (84.4 kg)  07/21/23 190 lb 12.8 oz (86.5 kg)  03/14/23 184 lb (83.5 kg)   GEN:  The patient appears stated age and is in NAD. HEENT:  Normocephalic, atraumatic.  The mucous membranes are moist. The superficial temporal arteries are without ropiness or tenderness. CV:  brady.  regular Lungs:  CTAB Neck/HEME:  There are no carotid bruits bilaterally.  Neurological examination:   Orientation: The patient is alert and oriented x3.  Cranial nerves: There is mild L facial droop. His L lower lid is very drooped.    Extraocular muscles are intact. The visual fields are full to confrontational testing. The speech is fluent and clear. Soft palate rises symmetrically and there is no tongue deviation. Hearing is intact to conversational tone. Sensation: Sensation is intact to light touch throughout  Motor: Strength is at least antigravity x 4   Movement examination: Tone: There is mod increased tone in the RUE and mild in the LUE Abnormal movements: there is intermittent LUE rest tremor Coordination:  There is decremation with toe taps on the R Gait and Station: The patient has no difficulty arising out of a deep-seated chair without the use of the hands. The patient's stride length is good.  He has decreased arm swing on the L.    I have reviewed and interpreted the following labs independently    Chemistry      Component Value Date/Time   NA 142 09/29/2021 0958   K 4.2 09/29/2021 0958   CL 104 09/29/2021 0958   CO2 26 09/29/2021 0958   BUN 8 09/29/2021 0958   CREATININE 0.83 09/29/2021 0958      Component Value Date/Time   CALCIUM  9.2 09/29/2021 0958   ALKPHOS 89 12/17/2019 1245   AST 23 10/07/2022 0911   ALT 13 10/07/2022 0911   BILITOT 0.6 12/17/2019 1245       Lab Results  Component Value Date   WBC 5.0 12/17/2019   HGB 14.3 12/17/2019   HCT 43.3 12/17/2019   MCV 88.0 12/17/2019   PLT 208 12/17/2019    No results found for: "TSH"   Total time spent on today's visit was 30 minutes, including both face-to-face time and nonface-to-face time.  Time included that spent on review of records (prior notes available to me/labs/imaging if pertinent), discussing treatment and goals, answering patient's questions and coordinating care.  Cc:  Russell Court, DO

## 2023-09-12 ENCOUNTER — Encounter: Payer: Self-pay | Admitting: Neurology

## 2023-09-12 ENCOUNTER — Ambulatory Visit: Payer: PPO | Admitting: Neurology

## 2023-09-12 VITALS — BP 124/63 | HR 62 | Wt 186.0 lb

## 2023-09-12 DIAGNOSIS — G20A1 Parkinson's disease without dyskinesia, without mention of fluctuations: Secondary | ICD-10-CM

## 2023-09-12 DIAGNOSIS — M2550 Pain in unspecified joint: Secondary | ICD-10-CM

## 2023-09-12 MED ORDER — CARBIDOPA-LEVODOPA 25-100 MG PO TABS: 1.5000 | ORAL_TABLET | Freq: Three times a day (TID) | ORAL | 1 refills | Status: DC

## 2023-09-12 NOTE — Patient Instructions (Signed)
 Increase carbidopa /levodopa  25/100, 1.5 tablets three times per day Follow up with primary care regarding the painful attacks  The physicians and staff at [facility] are committed to providing excellent care. You may receive a survey requesting feedback about your experience at our office. We strive to receive "very good" responses to the survey questions. If you feel that your experience would prevent you from giving the office a "very good " response, please contact our office to try to remedy the situation. We may be reached at [phone]. Thank you for taking the time out of your busy day to complete the survey.

## 2023-10-17 ENCOUNTER — Ambulatory Visit (HOSPITAL_BASED_OUTPATIENT_CLINIC_OR_DEPARTMENT_OTHER): Admitting: Family Medicine

## 2023-10-17 ENCOUNTER — Encounter (HOSPITAL_BASED_OUTPATIENT_CLINIC_OR_DEPARTMENT_OTHER): Payer: Self-pay | Admitting: Family Medicine

## 2023-10-17 VITALS — BP 117/65 | HR 76 | Temp 98.0°F | Resp 16 | Ht 68.9 in | Wt 188.1 lb

## 2023-10-17 DIAGNOSIS — S0591XS Unspecified injury of right eye and orbit, sequela: Secondary | ICD-10-CM | POA: Insufficient documentation

## 2023-10-17 DIAGNOSIS — E785 Hyperlipidemia, unspecified: Secondary | ICD-10-CM

## 2023-10-17 DIAGNOSIS — R5383 Other fatigue: Secondary | ICD-10-CM | POA: Diagnosis not present

## 2023-10-17 DIAGNOSIS — S0591XA Unspecified injury of right eye and orbit, initial encounter: Secondary | ICD-10-CM | POA: Insufficient documentation

## 2023-10-17 NOTE — Progress Notes (Unsigned)
 Established Patient Office Visit  Subjective   Patient ID: Wesley Jackson, male    DOB: November 09, 1940  Age: 83 y.o. MRN: [id number]  Chief Complaint  Patient presents with   Establish Care    Establish care   rt eye lid redness    Pt c/o red eyelid redness.    F/u as above.  Well known to me from [facility].  Generally doing well, though continuing to recover from surgery on his right lower eyelid.  Unfortunately he has had postop complications.  [facility] has referred him to [facility] for this.  No old records currently available.    Past Medical History:  Diagnosis Date   CAD (coronary artery disease)    f/by Dr. Bernie   Dyslipidemia 09/22/2021   GERD (gastroesophageal reflux disease)    History of prostate cancer    f/by Dr. Marda   History of ventricular tachycardia    Hypertension    Mitral regurgitation    Osteoarthritis    Parkinson's disease (HCC)    f/by Dr. Evonnie    Outpatient Encounter Medications as of 10/17/2023  Medication Sig   aspirin  EC 81 MG tablet Take 1 tablet (81 mg total) by mouth daily. Swallow whole.   carbidopa -levodopa  (SINEMET  IR) 25-100 MG tablet Take 1.5 tablets by mouth 3 (three) times daily.   erythromycin ophthalmic ointment SMARTSIG:In Eye(s)   finasteride (PROSCAR) 5 MG tablet Take 5 mg by mouth daily.   losartan (COZAAR) 25 MG tablet Take 25 mg by mouth daily.   metoprolol  tartrate (LOPRESSOR ) 25 MG tablet Take 0.5 tablets (12.5 mg total) by mouth 2 (two) times daily.   omeprazole (PRILOSEC) 20 MG capsule Take 20 mg by mouth every morning.   rosuvastatin  (CRESTOR ) 20 MG tablet Take 1 tablet (20 mg total) by mouth daily.   tadalafil (CIALIS) 5 MG tablet Take 5 mg by mouth daily as needed for erectile dysfunction. ED   tamsulosin (FLOMAX) 0.4 MG CAPS capsule Take 0.4 mg by mouth 3 (three) times daily.   No facility-administered encounter medications on file as of 10/17/2023.    Social History   Tobacco Use   Smoking status: Never    Smokeless tobacco: Never  Vaping Use   Vaping status: Never Used  Substance Use Topics   Alcohol use: Yes    Alcohol/week: 1.0 standard drink of alcohol    Types: 1 Cans of beer per week    Comment: 1 beer every other month   Drug use: No      Review of Systems  Constitutional:  Negative for diaphoresis, fever, malaise/fatigue and weight loss.  Respiratory:  Negative for cough, shortness of breath and wheezing.   Cardiovascular:  Negative for chest pain, palpitations, orthopnea, claudication, leg swelling and PND.      Objective:     BP 117/65 (BP Location: Right Arm, Patient Position: Standing, Cuff Size: Normal)   Pulse 76   Temp 98 F (36.7 C) (Oral)   Resp 16   Ht 5' 8.9 (1.75 m)   Wt 188 lb 1.6 oz (85.3 kg)   SpO2 95%   BMI 27.86 kg/m    Physical Exam Constitutional:      General: He is not in acute distress.    Appearance: Normal appearance.  HENT:     Head: Normocephalic.     Comments: Right eye with concerning inferolateral open wound.  Not currently infected. Neck:     Vascular: No carotid bruit.   Cardiovascular:  Rate and Rhythm: Normal rate and regular rhythm.     Pulses: Normal pulses.     Heart sounds: Normal heart sounds.  Pulmonary:     Effort: Pulmonary effort is normal.     Breath sounds: Normal breath sounds.  Abdominal:     General: Bowel sounds are normal.     Palpations: Abdomen is soft.   Musculoskeletal:     Cervical back: Neck supple. No tenderness.     Right lower leg: No edema.     Left lower leg: No edema.   Neurological:     Mental Status: He is alert.      No results found for any visits on 10/17/23.    The ASCVD Risk score (Arnett DK, et al., 2019) failed to calculate for the following reasons:   The 2019 ASCVD risk score is only valid for ages 25 to 60    Assessment & Plan:  Open eye wound, right, initial encounter Assessment & Plan: Promptly request/review old records.  I'll assist him, at his request,  in researching Oculoplastic specialists in the area.  I certainly share his concern about this very unfortunate surgical outcome.     Fatigue, unspecified type -     CBC with Differential/Platelet; Future -     Comprehensive metabolic panel with GFR; Future  Dyslipidemia -     Lipid panel; Future    Return in about 4 weeks (around 11/14/2023) for chronic follow-up.    REDDING PONCE NORLEEN FALCON., MD

## 2023-10-18 ENCOUNTER — Encounter (HOSPITAL_BASED_OUTPATIENT_CLINIC_OR_DEPARTMENT_OTHER): Payer: Self-pay | Admitting: Family Medicine

## 2023-10-18 ENCOUNTER — Other Ambulatory Visit (HOSPITAL_BASED_OUTPATIENT_CLINIC_OR_DEPARTMENT_OTHER)

## 2023-10-18 ENCOUNTER — Other Ambulatory Visit (HOSPITAL_BASED_OUTPATIENT_CLINIC_OR_DEPARTMENT_OTHER): Payer: Self-pay | Admitting: *Deleted

## 2023-10-18 DIAGNOSIS — E785 Hyperlipidemia, unspecified: Secondary | ICD-10-CM

## 2023-10-18 DIAGNOSIS — R5383 Other fatigue: Secondary | ICD-10-CM

## 2023-10-18 LAB — CBC WITH DIFFERENTIAL/PLATELET
Basophils Absolute: 0 10*3/uL (ref 0.0–0.2)
Basos: 0 %
EOS (ABSOLUTE): 0 10*3/uL (ref 0.0–0.4)
Eos: 1 %
Hematocrit: 44.4 % (ref 37.5–51.0)
Hemoglobin: 13.4 g/dL (ref 13.0–17.7)
Immature Grans (Abs): 0 10*3/uL (ref 0.0–0.1)
Immature Granulocytes: 0 %
Lymphocytes Absolute: 1.5 10*3/uL (ref 0.7–3.1)
Lymphs: 25 %
MCH: 27.7 pg (ref 26.6–33.0)
MCHC: 30.2 g/dL — ABNORMAL LOW (ref 31.5–35.7)
MCV: 92 fL (ref 79–97)
Monocytes Absolute: 0.4 10*3/uL (ref 0.1–0.9)
Monocytes: 7 %
Neutrophils Absolute: 3.9 10*3/uL (ref 1.4–7.0)
Neutrophils: 67 %
Platelets: 224 10*3/uL (ref 150–450)
RBC: 4.83 x10E6/uL (ref 4.14–5.80)
RDW: 12.4 % (ref 11.6–15.4)
WBC: 5.8 10*3/uL (ref 3.4–10.8)

## 2023-10-18 NOTE — Assessment & Plan Note (Signed)
 Promptly request/review old records.  I'll assist him, at his request, in researching Oculoplastic specialists in the area.  I certainly share his concern about this very unfortunate surgical outcome.

## 2023-10-19 ENCOUNTER — Other Ambulatory Visit (HOSPITAL_BASED_OUTPATIENT_CLINIC_OR_DEPARTMENT_OTHER): Payer: Self-pay | Admitting: Family Medicine

## 2023-10-19 ENCOUNTER — Ambulatory Visit (HOSPITAL_BASED_OUTPATIENT_CLINIC_OR_DEPARTMENT_OTHER): Payer: Self-pay | Admitting: Family Medicine

## 2023-10-19 DIAGNOSIS — S0591XS Unspecified injury of right eye and orbit, sequela: Secondary | ICD-10-CM

## 2023-10-19 LAB — LIPID PANEL
Chol/HDL Ratio: 2.4 ratio (ref 0.0–5.0)
Cholesterol, Total: 120 mg/dL (ref 100–199)
HDL: 50 mg/dL (ref >39)
LDL Chol Calc (NIH): 56 mg/dL (ref 0–99)
Triglycerides: 63 mg/dL (ref 0–149)
VLDL Cholesterol Cal: 14 mg/dL (ref 5–40)

## 2023-10-19 LAB — COMPREHENSIVE METABOLIC PANEL WITH GFR
ALT: 37 IU/L (ref 0–44)
AST: 21 IU/L (ref 0–40)
Albumin: 4.1 g/dL (ref 3.7–4.7)
Alkaline Phosphatase: 97 IU/L (ref 44–121)
BUN/Creatinine Ratio: 18 (ref 10–24)
BUN: 16 mg/dL (ref 8–27)
Bilirubin Total: 0.4 mg/dL (ref 0.0–1.2)
CO2: 22 mmol/L (ref 20–29)
Calcium: 9.3 mg/dL (ref 8.6–10.2)
Chloride: 108 mmol/L — ABNORMAL HIGH (ref 96–106)
Creatinine, Ser: 0.87 mg/dL (ref 0.76–1.27)
Globulin, Total: 1.8 g/dL (ref 1.5–4.5)
Glucose: 111 mg/dL — ABNORMAL HIGH (ref 70–99)
Potassium: 4.6 mmol/L (ref 3.5–5.2)
Sodium: 144 mmol/L (ref 134–144)
Total Protein: 5.9 g/dL — ABNORMAL LOW (ref 6.0–8.5)
eGFR: 87 mL/min/{1.73_m2} (ref >59)

## 2023-11-15 ENCOUNTER — Ambulatory Visit (INDEPENDENT_AMBULATORY_CARE_PROVIDER_SITE_OTHER): Admitting: Family Medicine

## 2023-11-15 ENCOUNTER — Encounter (HOSPITAL_BASED_OUTPATIENT_CLINIC_OR_DEPARTMENT_OTHER): Payer: Self-pay | Admitting: Family Medicine

## 2023-11-15 VITALS — BP 132/72 | HR 54 | Temp 98.6°F | Resp 16 | Ht 68.9 in | Wt 186.3 lb

## 2023-11-15 DIAGNOSIS — E785 Hyperlipidemia, unspecified: Secondary | ICD-10-CM

## 2023-11-15 DIAGNOSIS — H029 Unspecified disorder of eyelid: Secondary | ICD-10-CM | POA: Diagnosis not present

## 2023-11-15 DIAGNOSIS — I10 Essential (primary) hypertension: Secondary | ICD-10-CM

## 2023-11-15 DIAGNOSIS — S0591XS Unspecified injury of right eye and orbit, sequela: Secondary | ICD-10-CM | POA: Diagnosis not present

## 2023-11-15 NOTE — Progress Notes (Signed)
 Established Patient Office Visit  Subjective   Patient ID: Wesley Jackson, male    DOB: 15-Mar-1941  Age: 83 y.o. MRN: [id number]  Chief Complaint  Patient presents with   Follow-up    Follow-up visit     F/u as above.  Please see recent notes for details.  He is now seeing [facility] for his right eye wound, after unacceptable cosmetic results stemming from previous surgeries elsewhere.  Otherwise he seems well.    Past Medical History:  Diagnosis Date   CAD (coronary artery disease)    f/by Dr. Bernie   Dyslipidemia 09/22/2021   GERD (gastroesophageal reflux disease)    History of prostate cancer    f/by Dr. Marda   History of ventricular tachycardia    Hypertension    Mitral regurgitation    Osteoarthritis    Parkinson's disease (HCC)    f/by Dr. Evonnie    Outpatient Encounter Medications as of 11/15/2023  Medication Sig   erythromycin ophthalmic ointment Apply to eye.   aspirin  EC 81 MG tablet Take 1 tablet (81 mg total) by mouth daily. Swallow whole.   carbidopa -levodopa  (SINEMET  IR) 25-100 MG tablet Take 1.5 tablets by mouth 3 (three) times daily.   erythromycin ophthalmic ointment SMARTSIG:In Eye(s)   finasteride (PROSCAR) 5 MG tablet Take 5 mg by mouth daily.   losartan (COZAAR) 25 MG tablet Take 25 mg by mouth daily.   metoprolol  tartrate (LOPRESSOR ) 25 MG tablet Take 0.5 tablets (12.5 mg total) by mouth 2 (two) times daily.   omeprazole (PRILOSEC) 20 MG capsule Take 20 mg by mouth every morning.   rosuvastatin  (CRESTOR ) 20 MG tablet Take 1 tablet (20 mg total) by mouth daily.   tadalafil (CIALIS) 5 MG tablet Take 5 mg by mouth daily as needed for erectile dysfunction. ED   tamsulosin (FLOMAX) 0.4 MG CAPS capsule Take 0.4 mg by mouth 3 (three) times daily.   No facility-administered encounter medications on file as of 11/15/2023.    Social History   Tobacco Use   Smoking status: Never   Smokeless tobacco: Never  Vaping Use   Vaping status: Never Used  Substance  Use Topics   Alcohol use: Yes    Alcohol/week: 1.0 standard drink of alcohol    Types: 1 Cans of beer per week    Comment: 1 beer every other month   Drug use: No      ROS    Objective:     BP 132/72 (BP Location: Right Arm, Patient Position: Standing, Cuff Size: Normal)   Pulse (!) 54   Temp 98.6 F (37 C) (Oral)   Resp 16   Ht 5' 8.9 (1.75 m)   Wt 186 lb 4.8 oz (84.5 kg)   SpO2 92%   BMI 27.59 kg/m    Physical Exam Constitutional:      General: He is not in acute distress.    Appearance: Normal appearance.  HENT:     Head: Normocephalic.  Neck:     Vascular: No carotid bruit.  Cardiovascular:     Rate and Rhythm: Normal rate and regular rhythm.     Pulses: Normal pulses.     Heart sounds: Normal heart sounds.  Pulmonary:     Effort: Pulmonary effort is normal.     Breath sounds: Normal breath sounds.  Abdominal:     General: Bowel sounds are normal.     Palpations: Abdomen is soft.  Musculoskeletal:     Cervical back: Neck supple.  No tenderness.     Right lower leg: No edema.     Left lower leg: No edema.  Neurological:     Mental Status: He is alert.      No results found for any visits on 11/15/23.    The ASCVD Risk score (Arnett DK, et al., 2019) failed to calculate for the following reasons:   The 2019 ASCVD risk score is only valid for ages 24 to 43    Assessment & Plan:  Open eye wound, right, sequela Assessment & Plan: F/u with [facility] as directed.  Extended discussion about his options related to this wound.   Eyelid lesion, benign  Primary hypertension  Dyslipidemia    No follow-ups on file.    REDDING PONCE NORLEEN FALCON., MD

## 2023-11-15 NOTE — Assessment & Plan Note (Signed)
 F/u with [facility] as directed.  Extended discussion about his options related to this wound.

## 2023-11-29 ENCOUNTER — Ambulatory Visit (HOSPITAL_BASED_OUTPATIENT_CLINIC_OR_DEPARTMENT_OTHER)

## 2023-12-06 ENCOUNTER — Ambulatory Visit (HOSPITAL_BASED_OUTPATIENT_CLINIC_OR_DEPARTMENT_OTHER)

## 2024-01-10 ENCOUNTER — Other Ambulatory Visit (HOSPITAL_BASED_OUTPATIENT_CLINIC_OR_DEPARTMENT_OTHER): Payer: Self-pay | Admitting: Family Medicine

## 2024-01-10 DIAGNOSIS — I10 Essential (primary) hypertension: Secondary | ICD-10-CM

## 2024-01-10 MED ORDER — LOSARTAN POTASSIUM 25 MG PO TABS: 25.0000 mg | ORAL_TABLET | Freq: Every day | ORAL | 1 refills | Status: AC

## 2024-01-18 ENCOUNTER — Encounter: Payer: Self-pay | Admitting: Neurology

## 2024-02-14 ENCOUNTER — Telehealth: Payer: Self-pay | Admitting: *Deleted

## 2024-02-14 ENCOUNTER — Encounter: Payer: Self-pay | Admitting: Cardiology

## 2024-02-14 ENCOUNTER — Ambulatory Visit: Attending: Cardiology | Admitting: Cardiology

## 2024-02-14 VITALS — BP 140/70 | HR 69 | Ht 68.9 in | Wt 189.0 lb

## 2024-02-14 DIAGNOSIS — I2510 Atherosclerotic heart disease of native coronary artery without angina pectoris: Secondary | ICD-10-CM

## 2024-02-14 DIAGNOSIS — I493 Ventricular premature depolarization: Secondary | ICD-10-CM | POA: Diagnosis not present

## 2024-02-14 DIAGNOSIS — I472 Ventricular tachycardia, unspecified: Secondary | ICD-10-CM | POA: Diagnosis not present

## 2024-02-14 DIAGNOSIS — E785 Hyperlipidemia, unspecified: Secondary | ICD-10-CM

## 2024-02-14 NOTE — Progress Notes (Unsigned)
 Cardiology Office Note:    Date:  02/14/2024   ID:  Wesley Jackson, DOB January 05, 1941, MRN [id number]  PCP:  Dottie Norleen PHEBE PONCE, MD  Cardiologist:  Lamar Fitch, MD    Referring MD: Dottie Norleen PHEBE PONCE, MD   Chief Complaint  Patient presents with   Follow-up  Cardiac wise doing well  History of Present Illness:    Wesley Jackson is a 83 y.o. male past medical history significant for coronary artery disease, he did have coronary CT angio in 2023 which showed moderate stenosis of proximal mid LAD, fractional flow reserve was negative, mitral regurgitation with last echocardiogram done in March 2025 showing preserved ejection fraction mild mitral valve regurgitation, Parkinson, PVCs, ventricular tachycardia, comes today to months for follow-up cardiac wise doing well.  Denies having any chest pain tightness squeezing pressure burning chest.  Sometimes some uneasy sensation in the chest look like he feels some PVCs.  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  However at the same time he said he can walk climb stairs with no difficulties.  Past Medical History:  Diagnosis Date   CAD (coronary artery disease)    f/by Dr. Jessicah Croll   Dyslipidemia 09/22/2021   GERD (gastroesophageal reflux disease)    History of prostate cancer    f/by Dr. Marda   History of ventricular tachycardia    Hypertension    Mitral regurgitation    Osteoarthritis    Parkinson's disease (HCC)    f/by Dr. Evonnie    Past Surgical History:  Procedure Laterality Date   ACHILLES TENDON REPAIR Left yrs ago   ARTHROSCOPY KNEE W/ DRILLING Right yrs ago   CYSTOSCOPY N/A 12/20/2019   Procedure: CYSTOSCOPY FLEXIBLE;  Surgeon: Marda General, MD;  Location: [facility];  Service: Urology;  Laterality: N/A;   EYE SURGERY Bilateral 2019    cataracts both eyes   EYELID LACERATION REPAIR Right 08/09/2023   EYELID LACERATION REPAIR Right 08/26/2023   HERNIA REPAIR Bilateral yrs ago   inguinal hernia    NASAL TURBINATE REDUCTION  05/2019   PROSTATE BIOPSY  2021   RADIOACTIVE SEED IMPLANT N/A 12/20/2019   Procedure: RADIOACTIVE SEED IMPLANT/BRACHYTHERAPY IMPLANT;  Surgeon: Marda General, MD;  Location: [facility];  Service: Urology;  Laterality: N/A;   skin biopsies, nose  08/2019   SPACE OAR INSTILLATION N/A 12/20/2019   Procedure: SPACE OAR INSTILLATION;  Surgeon: Marda General, MD;  Location[facility];  Service: Urology;  Laterality: N/A;    Current Medications: Current Meds  Medication Sig   aspirin  EC 81 MG tablet Take 1 tablet (81 mg total) by mouth daily. Swallow whole.   carbidopa -levodopa  (SINEMET  IR) 25-100 MG tablet Take 1.5 tablets by mouth 3 (three) times daily.   erythromycin ophthalmic ointment Place 1 Application into the right eye at bedtime.   finasteride (PROSCAR) 5 MG tablet Take 5 mg by mouth daily.   losartan  (COZAAR ) 25 MG tablet Take 1 tablet (25 mg total) by mouth daily.   metoprolol  tartrate (LOPRESSOR ) 25 MG tablet Take 0.5 tablets (12.5 mg total) by mouth 2 (two) times daily.   omeprazole (PRILOSEC) 20 MG capsule Take 20 mg by mouth every morning.   rosuvastatin  (CRESTOR ) 20 MG tablet Take 1 tablet (20 mg total)  by mouth daily.   tadalafil (CIALIS) 5 MG tablet Take 5 mg by mouth daily.   tamsulosin (FLOMAX) 0.4 MG CAPS capsule Take 0.4 mg by mouth 2 (two) times daily.     Allergies:   Patient has no known allergies.   Social History   Socioeconomic History   Marital status: Married    Spouse name: Not on file   Number of children: Not on file   Years of education: Not on file   Highest education level: Not on file  Occupational History   Occupation: retired    Comment: Stage manager  Tobacco Use   Smoking status: Never   Smokeless tobacco: Never   Vaping Use   Vaping status: Never Used  Substance and Sexual Activity   Alcohol use: Yes    Alcohol/week: 1.0 standard drink of alcohol    Types: 1 Cans of beer per week    Comment: 1 beer every other month   Drug use: No   Sexual activity: Not Currently  Other Topics Concern   Not on file  Social History Narrative   Left and right handed    Retired   Do you live at home alone?   Who lives with you? wife   What type of home do you live in: 1 story or 2 story? one   Caffiene small cup of coffee. And one coke a day    Social Drivers of Health   Financial Resource Strain: Low Risk  (10/17/2023)   Overall Financial Resource Strain (CARDIA)    Difficulty of Paying Living Expenses: Not hard at all  Food Insecurity: No Food Insecurity (10/17/2023)   Hunger Vital Sign    Worried About Running Out of Food in the Last Year: Never true    Ran Out of Food in the Last Year: Never true  Transportation Needs: No Transportation Needs (10/17/2023)   PRAPARE - Administrator, Civil Service (Medical): No    Lack of Transportation (Non-Medical): No  Physical Activity: Sufficiently Active (10/17/2023)   Exercise Vital Sign    Days of Exercise per Week: 3 days    Minutes of Exercise per Session: 150+ min  Stress: No Stress Concern Present (10/17/2023)   [employer] of Occupational Health - Occupational Stress Questionnaire    Feeling of Stress: Not at all  Social Connections: Not on file     Family History: The patient's family history includes COPD in his mother; Diabetes in his father; Heart attack in his father. There is no history of Prostate cancer, Colon cancer, Pancreatic cancer, or Breast cancer. ROS:   Please see the history of present illness.    All 14 point review of systems negative except as described per history of present illness  EKGs/Labs/Other Studies Reviewed:         Recent Labs: 10/18/2023: ALT 37; BUN 16; Creatinine, Ser 0.87; Hemoglobin 13.4;  Platelets 224; Potassium 4.6; Sodium 144  Recent Lipid Panel    Component Value Date/Time   CHOL 120 10/18/2023 0907   TRIG 63 10/18/2023 0907   HDL 50 10/18/2023 0907   CHOLHDL 2.4 10/18/2023 0907   LDLCALC 56 10/18/2023 0907   LDLDIRECT 100 (H) 11/24/2021 1501    Physical Exam:    VS:  BP (!) 140/70   Pulse 69   Ht 5' 8.9 (1.75 m)   Wt 189 lb (85.7 kg)   SpO2 96%   BMI 27.99 kg/m     Wt Readings from Last 3  Encounters:  02/14/24 189 lb (85.7 kg)  11/15/23 186 lb 4.8 oz (84.5 kg)  10/17/23 188 lb 1.6 oz (85.3 kg)     GEN:  Well nourished, well developed in no acute distress HEENT: Normal NECK: No JVD; No carotid bruits LYMPHATICS: No lymphadenopathy CARDIAC: RRR, no murmurs, no rubs, no gallops RESPIRATORY:  Clear to auscultation without rales, wheezing or rhonchi  ABDOMEN: Soft, non-tender, non-distended MUSCULOSKELETAL:  No edema; No deformity  SKIN: Warm and dry LOWER EXTREMITIES: no swelling NEUROLOGIC:  Alert and oriented x 3 PSYCHIATRIC:  Normal affect   ASSESSMENT:    1. Coronary artery disease involving native coronary artery of native heart without angina pectoris   2. PVC's (premature ventricular contractions)   3. Ventricular tachycardia (HCC)   4. Dyslipidemia    PLAN:    In order of problems listed above:  Coronary disease.  Stable on appropriate guideline directed medical therapy, he need to hold his aspirin  for about 10 days before his procedure on the face.  I told him if he start having exertional chest pain him to let me know otherwise we will continue present management. Dyslipidemia he is on Crestor  20 which I will continue, did review [facility] which show me data from October 18, 2023, LDL 56 HDL 50.  Continue present management. 5.  Being followed by neurology. He required eyelid surgery.  I do not see any cardiac contraindication for this procedure.  He easily can get 4 METS   Medication Adjustments/Labs and Tests Ordered: Current medicines  are reviewed at length with the patient today.  Concerns regarding medicines are outlined above.  No orders of the defined types were placed in this encounter.  Medication changes: No orders of the defined types were placed in this encounter.   Signed, Lamar DOROTHA Fitch, MD, [facility] 02/14/2024 1:54 PM    [facility]

## 2024-02-14 NOTE — Telephone Encounter (Signed)
   Pre-operative Risk Assessment    Patient Name: Wesley Jackson  DOB: January 19, 1941 MRN: [id number]      Request for Surgical Clearance    Procedure:  MOHS on Rt Lower Eyelid with Repair  Date of Surgery:  Clearance 03/14/24  and 03/15/24                Surgeon:  Dr. Crecencio and Dr. Cristine Surgeon's Group or Practice Name:  [facility] Phone number:  [phone] Fax number:  [phone]   Type of Clearance Requested:   - Pharmacy:  Hold Aspirin  10 days before surgery   Type of Anesthesia:  Not Indicated   Additional requests/questions:    Bonney Arloa Donovan Levorn   02/14/2024, 1:37 PM

## 2024-02-14 NOTE — Patient Instructions (Signed)

## 2024-02-14 NOTE — Progress Notes (Unsigned)
 Assessment/Plan:   1.  Parkinsons Disease, diagnosed January, 2024  -continue carbidopa /levodopa  25/100, 1.5 tablets tid.   Set an alarm to remember middle of the day dosage.  -We discussed that it used to be thought that levodopa  would increase risk of melanoma but now it is believed that Parkinsons itself likely increases risk of melanoma. he is to get regular skin checks.  He is seeing dermatology in [place]  2.  Possible TIA, November 2023.  -Workup negative, including MRI brain with and without gadolinium in January, 2024 (done because of continued left facial droop).             -Patient on aspirin  and Crestor , 20 mg daily.  His LDL was only 35 and total cholesterol was actually quite low at 85.  3. Hx of Lightheadness  -doing well right now  -on losartan , tamsulosin, metoprolol .  He is off of the amplodipine b/c of swelling  -he is bradycardic now with the addition of metoprolol .  He is on it for runs of Vtach on zio patch.  Sees Dr. Kraskowski  4.  Joint pain and weakness  - I discussed today that this was unrelated to Parkinsons disease.  In fact, he got put on prednisone and everything got better and he was able to play golf again.  They thought that his Parkinson's disease got better, but I reiterated to him that this was further evidence that the joint pain and weakness was not from Parkinson's disease, but likely from arthritis send I asked him to follow-up with his primary care physician in that regard.  5.  Squamous cell carcinoma  - Patient is getting ready to undergo excision/Mohs surgery and subsequent care of the eyelid by oculoplastics at [facility].  Subjective:   Wesley Jackson was seen today in follow up for Parkinsons disease, diagnosed last visit.  My previous records were reviewed prior to todays visit as well as outside records available to me.  This patient is accompanied in the office by his spouse who supplements the history.  I slightly increased his levodopa  last  visit.  He tolerated that well, without side effects but he misses the middle dose frequently.  Last visit, he was telling me about attacks of joint pain and weakness that they were thinking was from Parkinson's, and I told him I did not think that that was the case.  I did tell him to try to take half tablet to 1 tablet of levodopa  when he had 1 of these attacks to see if that would help and if it did not, I told him he needed to follow-up with his primary care physician.  He states that hasn't had an attack lately but does tell me he was put on prednisone for something unrelated and felt that everything got better ; his wife reports his Parkinson's disease got better, with the exception of his tremor.  No falls.  No hallucinations.  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  Current prescribed movement disorder medications: Carbidopa /levodopa  25/100, 1.5 tablet 3 times per day   PREVIOUS MEDICATIONS: Sinemet   ALLERGIES:  No Known Allergies  CURRENT MEDICATIONS:  Current Meds  Medication Sig   aspirin  EC 81 MG tablet Take 1 tablet (81 mg total) by mouth daily. Swallow whole.   carbidopa -levodopa  (SINEMET  IR) 25-100 MG tablet Take 1.5 tablets by mouth 3 (  three) times daily.   erythromycin ophthalmic ointment Place 1 Application into the right eye at bedtime.   finasteride (PROSCAR) 5 MG tablet Take 5 mg by mouth daily.   losartan  (COZAAR ) 25 MG tablet Take 1 tablet (25 mg total) by mouth daily.   metoprolol  tartrate (LOPRESSOR ) 25 MG tablet Take 0.5 tablets (12.5 mg total) by mouth 2 (two) times daily.   omeprazole (PRILOSEC) 20 MG capsule Take 20 mg by mouth every morning.   rosuvastatin  (CRESTOR ) 20 MG tablet Take 1 tablet (20 mg total) by mouth daily.   tadalafil (CIALIS) 5 MG tablet Take 5 mg by mouth daily.   tamsulosin (FLOMAX) 0.4 MG CAPS capsule Take 0.4 mg by mouth 2  (two) times daily.     Objective:   PHYSICAL EXAMINATION:    VITALS:   Vitals:   02/16/24 1035  BP: 138/70  Pulse: 60  SpO2: 98%  Weight: 190 lb (86.2 kg)  Height: 5' 9 (1.753 m)      Wt Readings from Last 3 Encounters:  02/16/24 190 lb (86.2 kg)  02/14/24 189 lb (85.7 kg)  11/15/23 186 lb 4.8 oz (84.5 kg)   GEN:  The patient appears stated age and is in NAD. HEENT:  Normocephalic, atraumatic.  The mucous membranes are moist. The superficial temporal arteries are without ropiness or tenderness. CV:  brady.  regular Lungs:  CTAB Neck/HEME:  There are no carotid bruits bilaterally.  Neurological examination:   Orientation: The patient is alert and oriented x3.  Cranial nerves: There is mild L facial droop. His L lower lid is very drooped.   Extraocular muscles are intact. The visual fields are full to confrontational testing. The speech is fluent and clear. Soft palate rises symmetrically and there is no tongue deviation. Hearing is intact to conversational tone. Sensation: Sensation is intact to light touch throughout  Motor: Strength is at least antigravity x 4   Movement examination: Tone: There is mild to mod increased tone in the RUE and mild in the LUE Abnormal movements: there is intermittent LUE rest tremor, mild Coordination:  There is decremation with toe taps on the R Gait and Station: The patient has no difficulty arising out of a deep-seated chair without the use of the hands. The patient's stride length is good.  He has decreased arm swing on the L (stable)  I have reviewed and interpreted the following labs independently    Chemistry      Component Value Date/Time   NA 144 10/18/2023 0908   K 4.6 10/18/2023 0908   CL 108 (H) 10/18/2023 0908   CO2 22 10/18/2023 0908   BUN 16 10/18/2023 0908   CREATININE 0.87 10/18/2023 0908      Component Value Date/Time   CALCIUM  9.3 10/18/2023 0908   ALKPHOS 97 10/18/2023 0908   AST 21 10/18/2023 0908   ALT  37 10/18/2023 0908   BILITOT 0.4 10/18/2023 0908       Lab Results  Component Value Date   WBC 5.8 10/18/2023   HGB 13.4 10/18/2023   HCT 44.4 10/18/2023   MCV 92 10/18/2023   PLT 224 10/18/2023    No results found for: TSH   Total time spent on today's visit was 40 minutes, including both face-to-face time and nonface-to-face time.  Time included that spent on review of records (prior notes available to me/labs/imaging if pertinent), discussing treatment and goals, answering patient's questions and coordinating care.  Cc:  Dottie Norleen PHEBE PONCE, MD

## 2024-02-15 NOTE — Telephone Encounter (Signed)
   Patient Name: Wesley Jackson  DOB: 1941/02/27 MRN: [id number]  Primary Cardiologist: None  Chart reviewed as part of pre-operative protocol coverage. Given past medical history and time since last visit, based on ACC/AHA guidelines, Wesley Jackson is at acceptable risk for the planned procedure without further cardiovascular testing.   Per Dr.Krasowski, he requires eyelid surgery. I do not see any cardiac contraindication for this procedure. He easily can get 4 METS   Per office protocol, if patient is without any new symptoms or concerns at the time of their virtual visit, he may hold ASA for 10  days prior to procedure. Please resume ASA as soon as possible postprocedure, at the discretion of the surgeon.    The patient was advised that if he develops new symptoms prior to surgery to contact our office to arrange for a follow-up visit, and he verbalized understanding.  I will route this recommendation to the requesting party via Epic fax function and remove from pre-op pool.  Please call with questions.  Lamarr Satterfield, NP 02/15/2024, 9:57 AM

## 2024-02-16 ENCOUNTER — Ambulatory Visit (INDEPENDENT_AMBULATORY_CARE_PROVIDER_SITE_OTHER): Admitting: Neurology

## 2024-02-16 ENCOUNTER — Encounter: Payer: Self-pay | Admitting: Neurology

## 2024-02-16 VITALS — BP 138/70 | HR 60 | Ht 69.0 in | Wt 190.0 lb

## 2024-02-16 DIAGNOSIS — C4492 Squamous cell carcinoma of skin, unspecified: Secondary | ICD-10-CM

## 2024-02-16 DIAGNOSIS — M2550 Pain in unspecified joint: Secondary | ICD-10-CM | POA: Diagnosis not present

## 2024-02-16 DIAGNOSIS — G20A1 Parkinson's disease without dyskinesia, without mention of fluctuations: Secondary | ICD-10-CM | POA: Diagnosis not present

## 2024-02-16 NOTE — Patient Instructions (Signed)

## 2024-02-27 ENCOUNTER — Encounter: Payer: Self-pay | Admitting: Radiology

## 2024-03-07 ENCOUNTER — Ambulatory Visit (INDEPENDENT_AMBULATORY_CARE_PROVIDER_SITE_OTHER)

## 2024-03-07 ENCOUNTER — Encounter (HOSPITAL_BASED_OUTPATIENT_CLINIC_OR_DEPARTMENT_OTHER): Payer: Self-pay

## 2024-03-07 VITALS — BP 138/70 | Ht 69.0 in | Wt 185.0 lb

## 2024-03-07 DIAGNOSIS — Z0000 Encounter for general adult medical examination without abnormal findings: Secondary | ICD-10-CM | POA: Diagnosis not present

## 2024-03-07 NOTE — Patient Instructions (Signed)
 Wesley Jackson,  Thank you for taking the time for your Medicare Wellness Visit. I appreciate your continued commitment to your health goals. Please review the care plan we discussed, and feel free to reach out if I can assist you further.  Please note that Annual Wellness Visits do not include a physical exam. Some assessments may be limited, especially if the visit was conducted virtually. If needed, we may recommend an in-person follow-up with your provider.  Ongoing Care Seeing your primary care provider every 3 to 6 months helps us  monitor your health and provide consistent, personalized care.  Referrals If a referral was made during today's visit and you haven't received any updates within two weeks, please contact the referred provider directly to check on the status.  Recommended Screenings:  Health Maintenance  Topic Date Due   Zoster (Shingles) Vaccine (1 of 2) Never done   Pneumococcal Vaccine for age over 42 (2 of 2 - PCV) 02/01/2020   Medicare Annual Wellness Visit  10/19/2023   Flu Shot  11/25/2023   DTaP/Tdap/Td vaccine (3 - Tdap) 10/31/2031   Meningitis B Vaccine  Aged Out   COVID-19 Vaccine  Discontinued       03/07/2024    4:06 PM  Advanced Directives  Does Patient Have a Medical Advance Directive? Yes  Type of Advance Directive Living will    Vision: Annual vision screenings are recommended for early detection of glaucoma, cataracts, and diabetic retinopathy. These exams can also reveal signs of chronic conditions such as diabetes and high blood pressure.  Dental: Annual dental screenings help detect early signs of oral cancer, gum disease, and other conditions linked to overall health, including heart disease and diabetes.  Please see the attached documents for additional preventive care recommendations.

## 2024-03-07 NOTE — Progress Notes (Signed)
 I connected with  Gladis GORMAN Jackson on 03/07/24 by a audio enabled telemedicine application and verified that I am speaking with the correct person using two identifiers.  Patient Location: Home  Provider Location: Home Office  Persons Participating in Visit: Patient.  I discussed the limitations of evaluation and management by telemedicine. The patient expressed understanding and agreed to proceed.  Vital Signs: Because this visit was a virtual/telehealth visit, some criteria may be missing or patient reported. Any vitals not documented were not able to be obtained and vitals that have been documented are patient reported.  Because this visit was a virtual/telehealth visit,  certain criteria was not obtained, such a blood pressure, CBG if applicable, and timed get up and go. Any medications not marked as taking were not mentioned during the medication reconciliation part of the visit. Any vitals not documented were not able to be obtained due to this being a telehealth visit or patient was unable to self-report a recent blood pressure reading due to a lack of equipment at home via telehealth. Vitals that have been documented are verbally provided by the patient.  This visit was performed by a medical professional under my direct supervision. I was immediately available for consultation/collaboration. I have reviewed and agree with the Annual Wellness Visit documentation.  Chief Complaint  Patient presents with   Medicare Wellness     Subjective:   Wesley Jackson is a 83 y.o. male who presents for a Medicare Annual Wellness Visit.  Allergies (verified) Patient has no known allergies.   History: Past Medical History:  Diagnosis Date   CAD (coronary artery disease)    f/by Dr. Bernie   Dyslipidemia 09/22/2021   GERD (gastroesophageal reflux disease)    History of prostate cancer    f/by Dr. Marda   History of ventricular tachycardia    Hypertension    Mitral regurgitation     Osteoarthritis    Parkinson's disease (HCC)    f/by Dr. Evonnie   Past Surgical History:  Procedure Laterality Date   ACHILLES TENDON REPAIR Left yrs ago   ARTHROSCOPY KNEE W/ DRILLING Right yrs ago   CYSTOSCOPY N/A 12/20/2019   Procedure: CYSTOSCOPY FLEXIBLE;  Surgeon: Marda General, MD;  Location: [facility];  Service: Urology;  Laterality: N/A;   EYE SURGERY Bilateral 2019   cataracts both eyes   EYELID LACERATION REPAIR Right 08/09/2023   EYELID LACERATION REPAIR Right 08/26/2023   HERNIA REPAIR Bilateral yrs ago   inguinal hernia    NASAL TURBINATE REDUCTION  05/2019   PROSTATE BIOPSY  2021   RADIOACTIVE SEED IMPLANT N/A 12/20/2019   Procedure: RADIOACTIVE SEED IMPLANT/BRACHYTHERAPY IMPLANT;  Surgeon: Marda General, MD;  Location: [facility];  Service: Urology;  Laterality: N/A;   skin biopsies, nose  08/2019   SPACE OAR INSTILLATION N/A 12/20/2019   Procedure: SPACE OAR INSTILLATION;  Surgeon: Marda General, MD;  Location: [facility];  Service: Urology;  Laterality: N/A;   Family History  Problem Relation Age of Onset   COPD Mother    Diabetes Father    Heart attack Father    Prostate cancer Neg Hx    Colon cancer Neg Hx    Pancreatic cancer Neg Hx    Breast cancer Neg Hx    Social History   Occupational History   Occupation: retired    Comment: stage manager  Tobacco Use   Smoking status: Never   Smokeless tobacco: Never  Advertising Account Planner  Vaping status: Never Used  Substance and Sexual Activity   Alcohol use: Yes    Alcohol/week: 1.0 standard drink of alcohol    Types: 1 Cans of beer per week    Comment: 1 beer every other month   Drug use: No   Sexual activity: Not Currently   Tobacco Counseling Counseling given: Not Answered  SDOH Screenings   Food Insecurity: No Food Insecurity (10/17/2023)  Housing: Unknown (11/09/2023)   Received from [facility]  Transportation Needs: No Transportation  Needs (10/17/2023)  Depression (PHQ2-9): Low Risk  (03/07/2024)  Financial Resource Strain: Low Risk  (10/17/2023)  Physical Activity: Sufficiently Active (03/07/2024)  Social Connections: Socially Integrated (03/07/2024)  Stress: No Stress Concern Present (03/07/2024)  Tobacco Use: Low Risk  (03/07/2024)  Health Literacy: Adequate Health Literacy (03/07/2024)   See flowsheets for full screening details  Depression Screen PHQ 2 & 9 Depression Scale- Over the past 2 weeks, how often have you been bothered by any of the following problems? Little interest or pleasure in doing things: 0 Feeling down, depressed, or hopeless (PHQ Adolescent also includes...irritable): 0 PHQ-2 Total Score: 0 Trouble falling or staying asleep, or sleeping too much: 1 Feeling tired or having little energy: 0 Poor appetite or overeating (PHQ Adolescent also includes...weight loss): 0 Feeling bad about yourself - or that you are a failure or have let yourself or your family down: 0 Trouble concentrating on things, such as reading the newspaper or watching television (PHQ Adolescent also includes...like school work): 0 Moving or speaking so slowly that other people could have noticed. Or the opposite - being so fidgety or restless that you have been moving around a lot more than usual: 0 Thoughts that you would be better off dead, or of hurting yourself in some way: 0 PHQ-9 Total Score: 1 If you checked off any problems, how difficult have these problems made it for you to do your work, take care of things at home, or get along with other people?: Not difficult at all  Depression Treatment Depression Interventions/Treatment : EYV7-0 Score <4 Follow-up Not Indicated     Goals Addressed             This Visit's Progress    Patient Stated       Patient states he would like maintain current health        Visit info / Clinical Intake: Medicare Wellness Visit Type:: Initial Annual Wellness Visit Persons  participating in visit:: patient Medicare Wellness Visit Mode:: Telephone If telephone:: video declined Because this visit was a virtual/telehealth visit:: pt reported vitals If Telephone or Video please confirm:: I connected with the patient using audio enabled telemedicine application and verified that I am speaking with the correct person using two identifiers; I discussed the limitations of evaluation and management by telemedicine; The patient expressed understanding and agreed to proceed Patient Location:: home Provider Location:: home offic e Information given by:: patient Interpreter Needed?: No Pre-visit prep was completed: yes AWV questionnaire completed by patient prior to visit?: no Living arrangements:: lives with spouse/significant other Patient's Overall Health Status Rating: very good Typical amount of pain: none Does pain affect daily life?: no Are you currently prescribed opioids?: no  Dietary Habits and Nutritional Risks How many meals a day?: 3 Eats fruit and vegetables daily?: yes Most meals are obtained by: preparing own meals; having others provide food; eating out In the last 2 weeks, have you had any of the following?: none Diabetic:: no  Functional Status Activities of Daily Living (to include ambulation/medication): Independent Ambulation: Independent Medication Administration: Independent Home Management: Independent Manage your own finances?: yes Primary transportation is: driving Concerns about vision?: no *vision screening is required for WTM* Concerns about hearing?: no  Fall Screening Falls in the past year?: 1 Number of falls in past year: 0 Was there an injury with Fall?: 0 Fall Risk Category Calculator: 1 Patient Fall Risk Level: Low Fall Risk  Fall Risk Patient at Risk for Falls Due to: No Fall Risks Fall risk Follow up: Falls evaluation completed; Falls prevention discussed; Education provided  Home and Transportation Safety: All  rugs have non-skid backing?: (!) no All stairs or steps have railings?: (!) no Grab bars in the bathtub or shower?: (!) no Have non-skid surface in bathtub or shower?: yes Good home lighting?: yes Regular seat belt use?: yes Hospital stays in the last year:: no  Cognitive Assessment Difficulty concentrating, remembering, or making decisions? : no Will 6CIT or Mini Cog be Completed: no  Advance Directives (For Healthcare) Does Patient Have a Medical Advance Directive?: Yes Type of Advance Directive: Living will Copy of Living Will in Chart?: No - copy requested  Reviewed/Updated  Reviewed/Updated: Reviewed All (Medical, Surgical, Family, Medications, Allergies, Care Teams, Patient Goals)        Objective:    Today's Vitals   03/07/24 1603  BP: 138/70  Weight: 185 lb (83.9 kg)  Height: 5' 9 (1.753 m)   Body mass index is 27.32 kg/m.  Current Medications (verified) Outpatient Encounter Medications as of 03/07/2024  Medication Sig   aspirin  EC 81 MG tablet Take 1 tablet (81 mg total) by mouth daily. Swallow whole.   carbidopa -levodopa  (SINEMET  IR) 25-100 MG tablet Take 1.5 tablets by mouth 3 (three) times daily.   erythromycin ophthalmic ointment Place 1 Application into the right eye at bedtime.   finasteride (PROSCAR) 5 MG tablet Take 5 mg by mouth daily.   losartan  (COZAAR ) 25 MG tablet Take 1 tablet (25 mg total) by mouth daily.   metoprolol  tartrate (LOPRESSOR ) 25 MG tablet Take 0.5 tablets (12.5 mg total) by mouth 2 (two) times daily.   omeprazole (PRILOSEC) 20 MG capsule Take 20 mg by mouth every morning.   rosuvastatin  (CRESTOR ) 20 MG tablet Take 1 tablet (20 mg total) by mouth daily.   tadalafil (CIALIS) 5 MG tablet Take 5 mg by mouth daily.   tamsulosin (FLOMAX) 0.4 MG CAPS capsule Take 0.4 mg by mouth 2 (two) times daily.   No facility-administered encounter medications on file as of 03/07/2024.   Hearing/Vision screen Hearing Screening - Comments:: Patient  states he has no issues  Vision Screening - Comments:: Patient wears glasses  Immunizations and Health Maintenance Health Maintenance  Topic Date Due   Zoster Vaccines- Shingrix (1 of 2) Never done   Pneumococcal Vaccine: 50+ Years (2 of 2 - PCV) 02/01/2020   Medicare Annual Wellness (AWV)  10/19/2023   Influenza Vaccine  11/25/2023   DTaP/Tdap/Td (3 - Tdap) 10/31/2031   Meningococcal B Vaccine  Aged Out   COVID-19 Vaccine  Discontinued        Assessment/Plan:  This is a routine wellness examination for [facility].  Patient Care Team: Dottie Norleen PHEBE PONCE, MD as PCP - General (Family Medicine)  I have personally reviewed and noted the following in the patient's chart:   Medical and social history Use of alcohol, tobacco or illicit drugs  Current medications and supplements including opioid prescriptions. Functional ability and  status Nutritional status Physical activity Advanced directives List of other physicians Hospitalizations, surgeries, and ER visits in previous 12 months Vitals Screenings to include cognitive, depression, and falls Referrals and appointments  No orders of the defined types were placed in this encounter.  In addition, I have reviewed and discussed with patient certain preventive protocols, quality metrics, and best practice recommendations. A written personalized care plan for preventive services as well as general preventive health recommendations were provided to patient.   Lyle MARLA Right, [place]   03/07/2024   No follow-ups on file.  After Visit Summary: (MyChart) Due to this being a telephonic visit, the after visit summary with patients personalized plan was offered to patient via MyChart   Nurse Notes: nothing to report

## 2024-03-14 ENCOUNTER — Ambulatory Visit: Admitting: Neurology

## 2024-05-17 ENCOUNTER — Telehealth: Payer: Self-pay | Admitting: Neurology

## 2024-05-17 NOTE — Telephone Encounter (Signed)
 Martha(Spouse) called in stating that she spoke with the pharmacy on Monday, and was informed that they tried contacting to get permission to fill Rx for carbidopa -levodopa . She stated he has been at for at least 3 days maybe more.   PH: [phone]

## 2024-05-18 ENCOUNTER — Other Ambulatory Visit: Payer: Self-pay

## 2024-05-18 DIAGNOSIS — G20A1 Parkinson's disease without dyskinesia, without mention of fluctuations: Secondary | ICD-10-CM

## 2024-05-18 MED ORDER — CARBIDOPA-LEVODOPA 25-100 MG PO TABS: 1.5000 | ORAL_TABLET | Freq: Three times a day (TID) | ORAL | 1 refills | Status: AC

## 2024-05-18 NOTE — Telephone Encounter (Signed)
 RX sent and patient called. I had not received a Escribe or fax for this patients meds.

## 2024-08-16 ENCOUNTER — Ambulatory Visit: Admitting: Neurology
